# Patient Record
Sex: Female | Born: 1990 | Race: Black or African American | Hispanic: No | Marital: Single | Smoking: Current every day smoker
Health system: Southern US, Community
[De-identification: ages and names within clinical notes are randomized; demographics above are authoritative.]

## PROBLEM LIST (undated history)

## (undated) ENCOUNTER — Inpatient Hospital Stay (HOSPITAL_COMMUNITY): Payer: Self-pay

## (undated) DIAGNOSIS — A599 Trichomoniasis, unspecified: Secondary | ICD-10-CM

## (undated) DIAGNOSIS — Z789 Other specified health status: Secondary | ICD-10-CM

## (undated) DIAGNOSIS — D649 Anemia, unspecified: Secondary | ICD-10-CM

## (undated) HISTORY — PX: INDUCED ABORTION: SHX677

## (undated) HISTORY — PX: NO PAST SURGERIES: SHX2092

---
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
## 2010-03-22 NOTE — L&D Delivery Note (Signed)
Delivery Note At 1:04 AM a viable female was delivered via Vaginal, Spontaneous Delivery (Presentation: Left Occiput Anterior).  APGAR: 9/9 , ; weight to be done.   Placenta status: Intact, Spontaneous.  Cord: 3 vessels with the following complications: None.  Cord pH: not done  Anesthesia: None  Episiotomy: None Lacerations: None Suture Repair: n/a Est. Blood Loss (mL): 250  Mom to postpartum.  Baby to nursery-stable.  Mother is planning to put baby up for adoption.  Tanashia Ciesla H 03/18/2011, 1:19 AM

## 2010-05-10 ENCOUNTER — Emergency Department (HOSPITAL_COMMUNITY)
Admission: EM | Admit: 2010-05-10 | Discharge: 2010-05-10 | Disposition: A | Discharge status: Home / Self Care | Payer: Self-pay | Attending: Emergency Medicine | Admitting: Emergency Medicine

## 2010-05-10 DIAGNOSIS — A499 Bacterial infection, unspecified: Secondary | ICD-10-CM | POA: Insufficient documentation

## 2010-05-10 DIAGNOSIS — N949 Unspecified condition associated with female genital organs and menstrual cycle: Secondary | ICD-10-CM | POA: Insufficient documentation

## 2010-05-10 DIAGNOSIS — Z202 Contact with and (suspected) exposure to infections with a predominantly sexual mode of transmission: Secondary | ICD-10-CM | POA: Insufficient documentation

## 2010-05-10 DIAGNOSIS — R109 Unspecified abdominal pain: Secondary | ICD-10-CM | POA: Insufficient documentation

## 2010-05-10 DIAGNOSIS — N760 Acute vaginitis: Secondary | ICD-10-CM | POA: Insufficient documentation

## 2010-05-10 DIAGNOSIS — B9689 Other specified bacterial agents as the cause of diseases classified elsewhere: Secondary | ICD-10-CM | POA: Insufficient documentation

## 2010-05-10 LAB — BASIC METABOLIC PANEL
BUN: 6 mg/dL (ref 6–23)
Calcium: 9.6 mg/dL (ref 8.4–10.5)
Creatinine, Ser: 0.7 mg/dL (ref 0.4–1.2)
GFR calc non Af Amer: >60 mL/min (ref >60)
Glucose, Bld: 97 mg/dL (ref 70–99)
Sodium: 137 mEq/L (ref 135–145)

## 2010-05-10 LAB — URINALYSIS, ROUTINE W REFLEX MICROSCOPIC
Nitrite: NEGATIVE
Specific Gravity, Urine: 1.021 (ref 1.005–1.030)
Urobilinogen, UA: 1 mg/dL (ref 0.0–1.0)

## 2010-05-10 LAB — DIFFERENTIAL
Basophils Absolute: 0 10*3/uL (ref 0.0–0.1)
Eosinophils Relative: 0 % (ref 0–5)
Lymphocytes Relative: 34 % (ref 12–46)
Neutro Abs: 4.7 10*3/uL (ref 1.7–7.7)

## 2010-05-10 LAB — CBC
HCT: 34.2 % — ABNORMAL LOW (ref 36.0–46.0)
RDW: 15.5 % (ref 11.5–15.5)
WBC: 8.4 10*3/uL (ref 4.0–10.5)

## 2010-05-10 LAB — POCT PREGNANCY, URINE

## 2010-05-10 LAB — URINE MICROSCOPIC-ADD ON

## 2010-05-12 LAB — GC/CHLAMYDIA PROBE AMP, GENITAL

## 2010-06-06 LAB — CBC
Hemoglobin: 11.8 g/dL — ABNORMAL LOW (ref 12.0–15.0)
Platelets: 258 10*3/uL (ref 150–400)
RBC: 4.08 MIL/uL (ref 3.87–5.11)
WBC: 7.7 10*3/uL (ref 4.0–10.5)

## 2010-06-06 LAB — GC/CHLAMYDIA PROBE AMP, GENITAL

## 2010-06-06 LAB — WET PREP, GENITAL
Trich, Wet Prep: NONE SEEN
Yeast Wet Prep HPF POC: NONE SEEN

## 2010-06-06 LAB — POCT PREGNANCY, URINE

## 2010-06-06 LAB — HCG, SERUM, QUALITATIVE: Preg, Serum: NEGATIVE

## 2010-06-07 LAB — URINE MICROSCOPIC-ADD ON

## 2010-06-07 LAB — DIFFERENTIAL
Eosinophils Absolute: 0 10*3/uL (ref 0.0–0.7)
Eosinophils Relative: 1 % (ref 0–5)
Lymphocytes Relative: 37 % (ref 12–46)
Lymphs Abs: 2.3 10*3/uL (ref 0.7–4.0)
Monocytes Absolute: 0.5 10*3/uL (ref 0.1–1.0)
Monocytes Relative: 9 % (ref 3–12)

## 2010-06-07 LAB — WET PREP, GENITAL
Clue Cells Wet Prep HPF POC: NONE SEEN
WBC, Wet Prep HPF POC: NONE SEEN
Yeast Wet Prep HPF POC: NONE SEEN

## 2010-06-07 LAB — URINALYSIS, ROUTINE W REFLEX MICROSCOPIC
Glucose, UA: NEGATIVE mg/dL
Ketones, ur: NEGATIVE mg/dL
Protein, ur: NEGATIVE mg/dL
pH: 6 (ref 5.0–8.0)

## 2010-06-07 LAB — COMPREHENSIVE METABOLIC PANEL
ALT: 9 U/L (ref 0–35)
AST: 14 U/L (ref 0–37)
Albumin: 3.7 g/dL (ref 3.5–5.2)
Alkaline Phosphatase: 48 U/L (ref 39–117)
Calcium: 9.5 mg/dL (ref 8.4–10.5)
Glucose, Bld: 92 mg/dL (ref 70–99)
Potassium: 3.7 mEq/L (ref 3.5–5.1)
Sodium: 141 mEq/L (ref 135–145)
Total Protein: 6.8 g/dL (ref 6.0–8.3)

## 2010-06-07 LAB — POCT PREGNANCY, URINE

## 2010-06-07 LAB — GC/CHLAMYDIA PROBE AMP, GENITAL

## 2010-06-07 LAB — CBC
Hemoglobin: 12.3 g/dL (ref 12.0–15.0)
MCH: 28.5 pg (ref 26.0–34.0)
MCHC: 33.2 g/dL (ref 30.0–36.0)
Platelets: 273 10*3/uL (ref 150–400)
RDW: 14.8 % (ref 11.5–15.5)

## 2010-06-07 LAB — URINE CULTURE

## 2010-06-07 LAB — LIPASE, BLOOD: Lipase: 20 U/L (ref 11–59)

## 2010-07-20 ENCOUNTER — Emergency Department (HOSPITAL_COMMUNITY)
Admission: EM | Admit: 2010-07-20 | Discharge: 2010-07-20 | Disposition: A | Discharge status: Home / Self Care | Payer: Self-pay | Attending: Emergency Medicine | Admitting: Emergency Medicine

## 2010-07-20 DIAGNOSIS — R6883 Chills (without fever): Secondary | ICD-10-CM | POA: Insufficient documentation

## 2010-07-20 DIAGNOSIS — R197 Diarrhea, unspecified: Secondary | ICD-10-CM | POA: Insufficient documentation

## 2010-07-20 DIAGNOSIS — O9989 Other specified diseases and conditions complicating pregnancy, childbirth and the puerperium: Secondary | ICD-10-CM | POA: Insufficient documentation

## 2010-07-20 DIAGNOSIS — R112 Nausea with vomiting, unspecified: Secondary | ICD-10-CM | POA: Insufficient documentation

## 2010-07-20 LAB — URINALYSIS, ROUTINE W REFLEX MICROSCOPIC
Bilirubin Urine: NEGATIVE
Glucose, UA: NEGATIVE mg/dL
Hgb urine dipstick: NEGATIVE
Ketones, ur: >80 mg/dL — AB
Nitrite: NEGATIVE
Protein, ur: NEGATIVE mg/dL
Specific Gravity, Urine: 1.024 (ref 1.005–1.030)
Urobilinogen, UA: 1 mg/dL (ref 0.0–1.0)
pH: 7 (ref 5.0–8.0)

## 2010-07-20 LAB — URINE MICROSCOPIC-ADD ON

## 2010-07-20 LAB — POCT I-STAT, CHEM 8
BUN: <3 mg/dL — ABNORMAL LOW (ref 6–23)
Calcium, Ion: 1.18 mmol/L (ref 1.12–1.32)
Chloride: 101 mEq/L (ref 96–112)
Creatinine, Ser: 0.7 mg/dL (ref 0.4–1.2)
Glucose, Bld: 83 mg/dL (ref 70–99)
HCT: 37 % (ref 36.0–46.0)
Hemoglobin: 12.6 g/dL (ref 12.0–15.0)
Potassium: 3.6 meq/L (ref 3.5–5.1)
Sodium: 138 meq/L (ref 135–145)
TCO2: 24 mmol/L (ref 0–100)

## 2010-07-20 LAB — PREGNANCY, URINE: Preg Test, Ur: POSITIVE

## 2010-07-22 LAB — URINE CULTURE
Colony Count: 15000
Culture  Setup Time: 201205010855

## 2010-12-17 LAB — CBC
HCT: 37.8
Hemoglobin: 12.5
Platelets: 254
RDW: 14.9
WBC: 9.1

## 2011-01-12 ENCOUNTER — Encounter (HOSPITAL_COMMUNITY): Payer: Self-pay

## 2011-01-12 ENCOUNTER — Inpatient Hospital Stay (HOSPITAL_COMMUNITY)
Admission: AD | Admit: 2011-01-12 | Discharge: 2011-01-12 | Disposition: A | Discharge status: Home / Self Care | Payer: Medicaid Other | Source: Ambulatory Visit | Attending: Obstetrics & Gynecology | Admitting: Obstetrics & Gynecology

## 2011-01-12 ENCOUNTER — Inpatient Hospital Stay (HOSPITAL_COMMUNITY): Payer: Medicaid Other

## 2011-01-12 DIAGNOSIS — O99019 Anemia complicating pregnancy, unspecified trimester: Secondary | ICD-10-CM

## 2011-01-12 DIAGNOSIS — A5901 Trichomonal vulvovaginitis: Secondary | ICD-10-CM | POA: Insufficient documentation

## 2011-01-12 DIAGNOSIS — D649 Anemia, unspecified: Secondary | ICD-10-CM | POA: Diagnosis present

## 2011-01-12 DIAGNOSIS — J069 Acute upper respiratory infection, unspecified: Secondary | ICD-10-CM | POA: Insufficient documentation

## 2011-01-12 DIAGNOSIS — O99891 Other specified diseases and conditions complicating pregnancy: Secondary | ICD-10-CM | POA: Insufficient documentation

## 2011-01-12 DIAGNOSIS — D508 Other iron deficiency anemias: Secondary | ICD-10-CM

## 2011-01-12 DIAGNOSIS — O0930 Supervision of pregnancy with insufficient antenatal care, unspecified trimester: Secondary | ICD-10-CM

## 2011-01-12 DIAGNOSIS — O98819 Other maternal infectious and parasitic diseases complicating pregnancy, unspecified trimester: Secondary | ICD-10-CM | POA: Insufficient documentation

## 2011-01-12 DIAGNOSIS — Z8619 Personal history of other infectious and parasitic diseases: Secondary | ICD-10-CM

## 2011-01-12 HISTORY — DX: Other specified health status: Z78.9

## 2011-01-12 LAB — RAPID URINE DRUG SCREEN, HOSP PERFORMED
Barbiturates: NOT DETECTED
Cocaine: NOT DETECTED
Opiates: NOT DETECTED

## 2011-01-12 LAB — URINALYSIS, ROUTINE W REFLEX MICROSCOPIC
Hgb urine dipstick: NEGATIVE
Protein, ur: NEGATIVE mg/dL
Specific Gravity, Urine: 1.02 (ref 1.005–1.030)
Urobilinogen, UA: 2 mg/dL — ABNORMAL HIGH (ref 0.0–1.0)

## 2011-01-12 LAB — ABO/RH: ABO/RH(D): O POS

## 2011-01-12 LAB — WET PREP, GENITAL: Clue Cells Wet Prep HPF POC: NONE SEEN

## 2011-01-12 LAB — CBC
HCT: 25.1 % — ABNORMAL LOW (ref 36.0–46.0)
Hemoglobin: 7.9 g/dL — ABNORMAL LOW (ref 12.0–15.0)
MCH: 24.5 pg — ABNORMAL LOW (ref 26.0–34.0)
MCHC: 31.5 g/dL (ref 30.0–36.0)
MCV: 77.7 fL — ABNORMAL LOW (ref 78.0–100.0)
RBC: 3.23 MIL/uL — ABNORMAL LOW (ref 3.87–5.11)

## 2011-01-12 LAB — DIFFERENTIAL
Basophils Relative: 0 % (ref 0–1)
Eosinophils Absolute: 0 10*3/uL (ref 0.0–0.7)
Eosinophils Relative: 0 % (ref 0–5)
Lymphs Abs: 0.9 10*3/uL (ref 0.7–4.0)
Monocytes Absolute: 0.7 10*3/uL (ref 0.1–1.0)
Monocytes Relative: 10 % (ref 3–12)

## 2011-01-12 LAB — URINE MICROSCOPIC-ADD ON

## 2011-01-12 MED ORDER — FERROUS SULFATE 325 (65 FE) MG PO TABS: 325.0000 mg | ORAL_TABLET | Freq: Three times a day (TID) | ORAL | Status: DC

## 2011-01-12 MED ORDER — METRONIDAZOLE 500 MG PO TABS
2000.0000 mg | ORAL_TABLET | Freq: Once | ORAL | Status: AC
  Administered 2011-01-12: 2000 mg via ORAL
  Filled 2011-01-12: qty 4

## 2011-01-12 MED ORDER — PRENATAL RX 60-1 MG PO TABS: 1.0000 | ORAL_TABLET | Freq: Every day | ORAL | Status: DC

## 2011-01-12 NOTE — Progress Notes (Signed)
34WKS G2P1 NO PNC C/O PRODUCTIVE COUGH. NO ORDERS AT THIS TIME. WILL CONTINUE TO MONITOR.

## 2011-01-12 NOTE — ED Provider Notes (Signed)
History   pt is a 20 y/o G2P1001 who presents with c/o cold and cough, lightheadedness and white discharge PV  Chief Complaint  Patient presents with  . URI   URI  Associated symptoms include congestion and coughing. Pertinent negatives include no chest pain, diarrhea, dysuria, ear pain, headaches, rash, sore throat or wheezing.  : Pt reports of nasal congestion, cough with expectoration, running nose and watery eyes since the last 2 days. She report its getting worse, not tried anything, nothing makes it better and she reports exposure to her 64 y/o baby who has cold and cough symptoms over the past week. No h/o sore throat, headaches or difficulty swallowing. Pt also report of feeling lightheaded and colored spots when she gets up from a sitting or lying position since the start of her pregnancy that has got worse with time. She states that she feels dehydrated a lot and drinking water makes her feel better. She states to be really tired all the time. No h/o loss of consciousness, ringing in the ears, diplopia or weakness in her limbs. She has had a white discharge on and off since the start of her pregnancy which is thick and non-odorous. No h/o itching, burning during urination. She has h/o Chlamydia infection in 04/2010 for which she and her partner were treated. She is presently sexually active with another partner and does not use any barrier method of protection. She has not had any prenatal care and was not seen for any health concern after her present pregnancy diagnosis. She doesn't recall her LMP accurately and states that baby moves well. She has had no other health concerns during this pregnancy.  She states she wants to give the baby up for adoption.  OB History    Grav Para Term Preterm Abortions TAB SAB Ect Mult Living   2 1 1       1     1st pregnancy resulted in NSVD of a 8 lb 1 oz girl at term and had no complications. Baby is doing well presently. She had similar complaints of  lightheadedness and colored spots during that pregnancy and was told that it was due to dehydration.   Past Medical History  Diagnosis Date  . No pertinent past medical history     Past Surgical History  Procedure Date  . No past surgeries     No family history on file.  History  Substance Use Topics  . Smoking status: Current Everyday Smoker -- 0.5 packs/day  . Smokeless tobacco: Not on file  . Alcohol Use: No  Drug use: patient states that "she is around people smoking weed".  Allergies: No Known Allergies  No prescriptions prior to admission    Review of Systems  Constitutional: Positive for malaise/fatigue. Negative for fever, chills, weight loss and diaphoresis.  HENT: Positive for congestion. Negative for hearing loss, ear pain, nosebleeds, sore throat, tinnitus and ear discharge.   Eyes: Negative for blurred vision, double vision, pain, discharge and redness.  Respiratory: Positive for cough and sputum production. Negative for hemoptysis, wheezing and stridor.   Cardiovascular: Negative for chest pain, palpitations, claudication and leg swelling.  Gastrointestinal: Negative for heartburn, diarrhea, constipation, blood in stool and melena.  Genitourinary: Negative for dysuria, urgency and frequency.  Skin: Negative for itching and rash.  Neurological: Positive for dizziness. Negative for tremors, focal weakness, seizures, loss of consciousness and headaches.  Endo/Heme/Allergies: Negative for polydipsia. Does not bruise/bleed easily.   Physical Exam   Blood pressure  101/66, pulse 117, temperature 98.5 F (36.9 C), temperature source Oral, resp. rate 16, height 5' 2.25" (1.581 m), weight 61.78 kg (136 lb 3.2 oz), last menstrual period 05/17/2010, SpO2 98.00%. - LMP unsure  Physical Exam  Vitals reviewed. Constitutional: She appears well-developed. She appears distressed.       Mild distress  HENT:  Head: Normocephalic and atraumatic.  Right Ear: External ear  normal. No drainage. No decreased hearing is noted.  Left Ear: External ear normal. No drainage. No decreased hearing is noted.  Nose: Mucosal edema and rhinorrhea present. No nose lacerations or sinus tenderness. No epistaxis. Right sinus exhibits no maxillary sinus tenderness and no frontal sinus tenderness. Left sinus exhibits no maxillary sinus tenderness and no frontal sinus tenderness.  Mouth/Throat: No oral lesions. Normal dentition. No uvula swelling. No oropharyngeal exudate, posterior oropharyngeal edema or tonsillar abscesses.  Eyes: Conjunctivae are normal. Right eye exhibits no discharge. Left eye exhibits no discharge. No scleral icterus.       Pallor +  Cardiovascular: Normal rate, regular rhythm, normal heart sounds and intact distal pulses.   Respiratory: Effort normal and breath sounds normal. No respiratory distress. She has no wheezes. She has no rales. She exhibits no tenderness.  GI: Soft. Bowel sounds are normal.  Genitourinary: There is no rash or tenderness on the right labia. There is no rash or tenderness on the left labia. No erythema or bleeding around the vagina. No foreign body around the vagina. No signs of injury around the vagina. Vaginal discharge found.       Fundal height: 33 cm  Lymphadenopathy:    She has no cervical adenopathy.  Skin: Skin is dry. No cyanosis. No pallor.       Nails: pallor +    MAU Course  Procedures ANC panel Pelvic and Cervical examination, Wet Prep CBC, HB Electrophoresis Urine Drug Screen Assessment and Plan  1. US Abdomen and Pelvis: showed estimated gestation age 29 weeks 1 day with no suspected disproportion in growth. Placenta attached to anterior wall 2. Lightheadedness: likely due to anemia RBC 3.23, Hb 7.9, Hct 25.1. Refer to clinic for possible follow up for her pregnancy with results of Hb Electrophoresis. Iron supplements prescribed   3. Cold and Cough: Viral URI, Mucin ex during day time and cough drops during the  night to help her sleep. Warm drinks and plenty of fluids to keep her hydrated 3. Discharge PV: Likely Vaginal candidiasis. Possible GC/ Chlamydia -  Work up ordered 4. Urine drug screen - to check for possible illicit drug use during pregnancy     Chetan Kapat 01/12/2011, 5:45 PM   Additional Dx: Trichomonas-Tx Flagyl 2 gm PO now. [facility]    Plan: 1. Will schedule appt at [facility] in 2 weeks 2. FeSO4 325 PO TID and increase iron rich-foods 3. PLT precautions  Dorathy Kinsman 01/12/2011 10:23 PM

## 2011-01-12 NOTE — ED Provider Notes (Signed)
Attestation of Attending Supervision of Advanced Practitioner: Evaluation and management procedures were performed by the PA/NP/CNM/OB Fellow under my supervision/collaboration. Chart reviewed and agree with management and plan.  Jaynie Collins A M.D. 01/12/2011 11:50 PM

## 2011-01-12 NOTE — Progress Notes (Signed)
Pt c/o productive cough with thin clear mucous since yesterday. No fever. Abdominal pain when she coughs. Taking nothing for cough or pain. Pt has had no PNC.

## 2011-01-12 NOTE — Progress Notes (Signed)
Pt returned from u/s via wc, PA student at bedside to collect H&P.

## 2011-01-12 NOTE — Progress Notes (Signed)
Patient states she has had no prenatal care and is advanced pregnant. Has had a cold for the past couple of days. Has upper abdominal pain with coughing. No bleeding or leaking and reports good fetal movement.

## 2011-01-13 LAB — GC/CHLAMYDIA PROBE AMP, GENITAL: Chlamydia, DNA Probe: NEGATIVE

## 2011-01-14 ENCOUNTER — Encounter (HOSPITAL_COMMUNITY): Payer: Self-pay | Admitting: *Deleted

## 2011-01-14 ENCOUNTER — Inpatient Hospital Stay (HOSPITAL_COMMUNITY)
Admission: AD | Admit: 2011-01-14 | Discharge: 2011-01-14 | Disposition: A | Discharge status: Home / Self Care | Payer: Medicaid Other | Source: Ambulatory Visit | Attending: Obstetrics and Gynecology | Admitting: Obstetrics and Gynecology

## 2011-01-14 DIAGNOSIS — J069 Acute upper respiratory infection, unspecified: Secondary | ICD-10-CM | POA: Insufficient documentation

## 2011-01-14 DIAGNOSIS — B9789 Other viral agents as the cause of diseases classified elsewhere: Secondary | ICD-10-CM

## 2011-01-14 DIAGNOSIS — O99891 Other specified diseases and conditions complicating pregnancy: Secondary | ICD-10-CM | POA: Insufficient documentation

## 2011-01-14 HISTORY — DX: Anemia, unspecified: D64.9

## 2011-01-14 LAB — HEMOGLOBINOPATHY EVALUATION
Hemoglobin Other: 0 % (ref 0.0–0.0)
Hgb A: 97.2 % (ref 96.8–97.8)
Hgb S Quant: 0 % (ref 0.0–0.0)

## 2011-01-14 MED ORDER — GUAIFENESIN 100 MG/5ML PO SOLN
5.0000 mL | Freq: Once | ORAL | Status: AC
  Administered 2011-01-14: 100 mg via ORAL
  Filled 2011-01-14: qty 15

## 2011-01-14 MED ORDER — PROMETHAZINE HCL 25 MG PO TABS
12.5000 mg | ORAL_TABLET | Freq: Once | ORAL | Status: AC
  Administered 2011-01-14: 12.5 mg via ORAL
  Filled 2011-01-14: qty 1

## 2011-01-14 NOTE — Progress Notes (Signed)
G2P1 at 31.2wks. Awoke at 2345 and was having trouble breathing. Has had cold for 3 days. Denies fever. Has h/a and productive cough-yellow sputum.

## 2011-01-14 NOTE — ED Provider Notes (Signed)
History     Chief Complaint  Patient presents with  . URI   HPI Patient presents to MAU via EMS for URI. She says she awoke from sleep with shortness of breath, so she called an ambulance. She currently has a cough, back pain, headache, nausea and vomiting. This has been presents for three to four days and has not changed since her visit to the MAU on 10/23. At this time she was prescribed Mucinex. However, she has not purchased the medication, because she does the money. The patient denies contractions, leakage of fluid, vaginal bleeding, and notes fetal movement.    OB History    Grav Para Term Preterm Abortions TAB SAB Ect Mult Living   2 1 1       1       Past Medical History  Diagnosis Date  . No pertinent past medical history   . Anemia     Past Surgical History  Procedure Date  . No past surgeries     No family history on file.  History  Substance Use Topics  . Smoking status: Current Everyday Smoker -- 0.5 packs/day  . Smokeless tobacco: Not on file  . Alcohol Use: No    Allergies: No Known Allergies  Prescriptions prior to admission  Medication Sig Dispense Refill  . ferrous sulfate 325 (65 FE) MG tablet Take 1 tablet (325 mg total) by mouth 3 (three) times daily with meals.  30 tablet  11  . Prenatal Vit-Fe Fumarate-FA (PRENATAL MULTIVITAMIN) 60-1 MG tablet Take 1 tablet by mouth daily.  30 tablet  6    ROS See HPI Physical Exam   Blood pressure 107/61, pulse 73, temperature 97.1 F (36.2 C), resp. rate 20, height 5\' 2"  (1.575 m), weight 61.689 kg (136 lb), last menstrual period 05/17/2010, SpO2 99.00%.  Physical Exam  Constitutional: She appears well-developed. She appears distressed.  HENT:  Mouth/Throat: Oropharynx is clear and moist. No oropharyngeal exudate.  Eyes: Conjunctivae are normal. Pupils are equal, round, and reactive to light.  Respiratory: Effort normal and breath sounds normal.       Persistent Cough   GI:       Gravid, non-tender    Lymphadenopathy:    She has no cervical adenopathy.    MAU Course  Procedures None MDM Fetoscope - 140s, moderate variability, no accels, no decels;  Tocometer - no uterine contractions   Assessment and Plan  Patient with viral URI, Given patient anti-tussive and promethazine for nausea.  Encouraged to follow-up for [facility] on 11/13 at [facility].   Clinton Sawyer, Kyandra Mcclaine 01/14/2011, 2:54 AM

## 2011-01-14 NOTE — Progress Notes (Signed)
Pt reports she awakened at 2130 and was having trouble breathing. Has had cold symptoms x 3 days, cough,congestion. No fever. Was seen yesterday and told to take Mucinex but has not taken any medicines today.complains of headache, pain in upper back.

## 2011-01-15 ENCOUNTER — Inpatient Hospital Stay (HOSPITAL_COMMUNITY)
Admission: AD | Admit: 2011-01-15 | Discharge: 2011-01-15 | Disposition: A | Discharge status: Home / Self Care | Payer: Medicaid Other | Source: Ambulatory Visit | Attending: Obstetrics & Gynecology | Admitting: Obstetrics & Gynecology

## 2011-01-15 ENCOUNTER — Encounter (HOSPITAL_COMMUNITY): Payer: Self-pay | Admitting: *Deleted

## 2011-01-15 DIAGNOSIS — O2390 Unspecified genitourinary tract infection in pregnancy, unspecified trimester: Secondary | ICD-10-CM | POA: Insufficient documentation

## 2011-01-15 DIAGNOSIS — N760 Acute vaginitis: Secondary | ICD-10-CM | POA: Insufficient documentation

## 2011-01-15 DIAGNOSIS — Z202 Contact with and (suspected) exposure to infections with a predominantly sexual mode of transmission: Secondary | ICD-10-CM | POA: Insufficient documentation

## 2011-01-15 MED ORDER — CEFTRIAXONE SODIUM 250 MG IJ SOLR
250.0000 mg | Freq: Once | INTRAMUSCULAR | Status: AC
  Administered 2011-01-15: 250 mg via INTRAMUSCULAR
  Filled 2011-01-15: qty 250

## 2011-01-15 MED ORDER — FLUCONAZOLE 200 MG PO TABS
200.0000 mg | ORAL_TABLET | Freq: Once | ORAL | Status: AC
  Administered 2011-01-15: 200 mg via ORAL
  Filled 2011-01-15: qty 1

## 2011-01-15 MED ORDER — AZITHROMYCIN 250 MG PO TABS
1000.0000 mg | ORAL_TABLET | Freq: Once | ORAL | Status: AC
  Administered 2011-01-15: 1000 mg via ORAL
  Filled 2011-01-15: qty 4

## 2011-01-15 MED ORDER — FLUCONAZOLE 200 MG PO TABS: 200.0000 mg | ORAL_TABLET | Freq: Once | ORAL | Status: AC

## 2011-01-15 NOTE — Progress Notes (Signed)
Pt was told she needed to be seen due to partner being diagnosed with non gonococcal urethritis.  Denies any abdominal pain, bleeding, or abnormal discharge.  + FM.

## 2011-01-15 NOTE — ED Provider Notes (Signed)
Chief Complaint:  Vaginal Itching   Tina Knox is  20 y.o. G2P1001.  Patient's last menstrual period was 05/17/2010.. [redacted]w[redacted]d   She presents complaining of Vaginal Itching Reports boyfriend was seen at [facility] yesterday and dx with NGU. Pt was dx with Trich in MAU earlier this week and tx'd with flagyl. Presents today with report of clitoral swelling, vaginal itching, and concern given boyfriend's recent dx.   Obstetrical/Gynecological History: OB History    Grav Para Term Preterm Abortions TAB SAB Ect Mult Living   2 1 1       1       Past Medical History: Past Medical History  Diagnosis Date  . No pertinent past medical history   . Anemia     Past Surgical History: Past Surgical History  Procedure Date  . No past surgeries     Family History: No family history on file.  Social History: History  Substance Use Topics  . Smoking status: Current Everyday Smoker -- 0.5 packs/day  . Smokeless tobacco: Not on file  . Alcohol Use: No    Allergies: No Known Allergies  Prescriptions prior to admission  Medication Sig Dispense Refill  . ferrous sulfate 325 (65 FE) MG tablet Take 1 tablet (325 mg total) by mouth 3 (three) times daily with meals.  30 tablet  11  . Prenatal Vit-Fe Fumarate-FA (PRENATAL MULTIVITAMIN) 60-1 MG tablet Take 1 tablet by mouth daily.  30 tablet  6    Review of Systems - Negative except what has been reviewed in HPI  Physical Exam   Blood pressure 99/61, pulse 92, temperature 98.1 F (36.7 C), resp. rate 18, height 5\' 2"  (1.575 m), weight 61.689 kg (136 lb), last menstrual period 05/17/2010.  General: General appearance - alert, well appearing, and in no distress and oriented to person, place, and time Mental status - alert, oriented to person, place, and time, normal mood, behavior, speech, dress, motor activity, and thought processes, affect appropriate to mood Abdomen - gravid, nontender FHR: 130, mod variabilty, +accels, no decels Toco: 2 UC in  2 hours Focused Gynecological Exam: VULVA: vulvar edema , vulvar erythema, clitoral irritation noted. While clumpy discharge on perineum  MDM: Exposure to STI   Assessment: Vaginitis Exposure to STI  Plan: Prophylaxis with Zithromax and Rocephin Dilfucan 200mg  in MAU Discharge home. FU as scheduled for prenatal visit.  Sherran Margolis E. 01/15/2011,4:30 PM

## 2011-01-15 NOTE — Progress Notes (Signed)
Treated in MAU on Tuesday for Trich, boyfriend has since been Dx with non-gonococcal urethritis and she has vaginal soreness and redness

## 2011-01-26 ENCOUNTER — Encounter: Payer: Self-pay | Admitting: Advanced Practice Midwife

## 2011-01-26 DIAGNOSIS — F1290 Cannabis use, unspecified, uncomplicated: Secondary | ICD-10-CM | POA: Insufficient documentation

## 2011-02-02 ENCOUNTER — Ambulatory Visit (INDEPENDENT_AMBULATORY_CARE_PROVIDER_SITE_OTHER): Payer: Medicaid Other

## 2011-02-02 DIAGNOSIS — O99320 Drug use complicating pregnancy, unspecified trimester: Secondary | ICD-10-CM

## 2011-02-02 DIAGNOSIS — O0930 Supervision of pregnancy with insufficient antenatal care, unspecified trimester: Secondary | ICD-10-CM

## 2011-02-02 DIAGNOSIS — O99019 Anemia complicating pregnancy, unspecified trimester: Secondary | ICD-10-CM

## 2011-02-02 DIAGNOSIS — Z3480 Encounter for supervision of other normal pregnancy, unspecified trimester: Secondary | ICD-10-CM

## 2011-02-02 DIAGNOSIS — Z23 Encounter for immunization: Secondary | ICD-10-CM

## 2011-02-02 MED ORDER — INFLUENZA VIRUS VACC SPLIT PF IM SUSP: 0.5000 mL | INTRAMUSCULAR | Status: AC

## 2011-02-02 MED ORDER — TETANUS-DIPHTH-ACELL PERTUSSIS 5-2.5-18.5 LF-MCG/0.5 IM SUSP: 0.5000 mL | Freq: Once | INTRAMUSCULAR | Status: DC

## 2011-02-02 NOTE — Progress Notes (Signed)
Pt received education booklet and smoking cessation pamphlet as well.  Pt received flu vaccine and also Tdap vaccine.

## 2011-02-08 ENCOUNTER — Ambulatory Visit (INDEPENDENT_AMBULATORY_CARE_PROVIDER_SITE_OTHER): Payer: Medicaid Other | Admitting: Obstetrics & Gynecology

## 2011-02-08 DIAGNOSIS — F1290 Cannabis use, unspecified, uncomplicated: Secondary | ICD-10-CM

## 2011-02-08 DIAGNOSIS — F1210 Cannabis abuse, uncomplicated: Secondary | ICD-10-CM

## 2011-02-08 DIAGNOSIS — Z0282 Encounter for adoption services: Secondary | ICD-10-CM

## 2011-02-08 DIAGNOSIS — F1920 Other psychoactive substance dependence, uncomplicated: Secondary | ICD-10-CM

## 2011-02-08 DIAGNOSIS — O0930 Supervision of pregnancy with insufficient antenatal care, unspecified trimester: Secondary | ICD-10-CM

## 2011-02-08 LAB — POCT URINALYSIS DIP (DEVICE)
Bilirubin Urine: NEGATIVE
Hgb urine dipstick: NEGATIVE
Ketones, ur: NEGATIVE mg/dL
Nitrite: NEGATIVE
pH: 7.5 (ref 5.0–8.0)

## 2011-02-08 NOTE — Progress Notes (Signed)
Patient considering putting baby up for adoption, she will meet with SW today.  No other complaints.  Fetal movement and preterm labor precautions reviewed. Return to clinic in 1 week.

## 2011-02-08 NOTE — Patient Instructions (Addendum)
Adoption  Adoption is a legal and permanent agreement that is coordinated by adoption agencies and lawyers. There are many resources available to help you place your child up for adoption or understand how to adopt a child. Adoption can take months to years and patience is very helpful during this exciting and stressful time.  TYPES OF ADOPTION  Domestic - Children are adopted within their country of birth. Adoption processes, waiting periods, and other legal rules can vary from state to state.   Open/Semi-open- Communication between the birth parent(s) and the adoptive parent(s) occurs.   Closed - The adoption is anonymous. There is no communication between the birth parent(s) and the adoptive parent(s) and only general and private information may be exchanged.   International Engineer, building services) - Children from one country are adopted by families from a different country. International adoption processes, waiting periods, and other legal rules vary from country to country.   The [employer] is an agreement between participating countries that adds a level of process and standards to foster a healthy and safe international adoption.  HOW DOES ADOPTION WORK?  Most adoptions are handled by public or private adoption agencies. Agency staff screen, interview, and assist with the matching of birth parents and families wishing to adopt. They provide counseling and support. All public and many private agencies are licensed and regulated by the government.  In independent adoptions, the biological parents and families wishing to adopt have chosen to privately coordinate an adoption. Independent adoptions are handled by a Clinical research associate, rather than an agency, and are only legal in certain states where they are regulated by strict laws. Malen Gauze parents sometimes have the option of adopting their foster children. Foster care refers to temporarily parenting a child who has been separated from his or her  parents. Foster care is a cooperative effort between foster parents and a Chief of Staff, while adoption is full legal responsibility. Some steps that occur during an adoption include:  Contacting a licensed adoption agency of your choice.   Beginning the application and screening process.   Participating in a home study interview and assessment.   Taking any necessary classes or programs about adoption and parenting an adopted child.   Waiting for placement information (adoptive parents) or review placement information (biological parents).   Meeting your child.   Legalizing the adoption by filing specific legal documents, or participating in court proceedings.  FOR MORE INFORMATION  There are many resources and programs available to help you navigate the entire adoption process. Contact your state's health and human services agency. Also, several online resources exist, such as: Infant Adoption Awareness: www.infantadopt.Kelly Services for Adoption: www.adoptioncouncil.org [place] Department of State ([employer]) http://adoption.MakeupDiscounts.com.br.php Document Released: 08/26/2009 Document Revised: 11/18/2010 Document Reviewed: 08/26/2009 [facility] Patient Information 2012 [place], [place].

## 2011-03-17 ENCOUNTER — Encounter (HOSPITAL_COMMUNITY): Payer: Self-pay

## 2011-03-17 ENCOUNTER — Inpatient Hospital Stay (HOSPITAL_COMMUNITY)
Admission: AD | Admit: 2011-03-17 | Discharge: 2011-03-20 | DRG: 774 | Disposition: A | Discharge status: Home / Self Care | Payer: Medicaid Other | Source: Ambulatory Visit | Attending: Obstetrics & Gynecology | Admitting: Obstetrics & Gynecology

## 2011-03-17 DIAGNOSIS — O9902 Anemia complicating childbirth: Principal | ICD-10-CM | POA: Diagnosis present

## 2011-03-17 DIAGNOSIS — Z0282 Encounter for adoption services: Secondary | ICD-10-CM

## 2011-03-17 DIAGNOSIS — D649 Anemia, unspecified: Secondary | ICD-10-CM

## 2011-03-17 DIAGNOSIS — O0930 Supervision of pregnancy with insufficient antenatal care, unspecified trimester: Secondary | ICD-10-CM

## 2011-03-17 DIAGNOSIS — O9882 Other maternal infectious and parasitic diseases complicating childbirth: Secondary | ICD-10-CM | POA: Diagnosis present

## 2011-03-17 DIAGNOSIS — A5901 Trichomonal vulvovaginitis: Secondary | ICD-10-CM | POA: Diagnosis present

## 2011-03-17 LAB — CBC
Hemoglobin: 11.9 g/dL — ABNORMAL LOW (ref 12.0–15.0)
MCH: 26.7 pg (ref 26.0–34.0)
MCV: 81.8 fL (ref 78.0–100.0)
RBC: 4.46 MIL/uL (ref 3.87–5.11)
WBC: 8.6 10*3/uL (ref 4.0–10.5)

## 2011-03-17 MED ORDER — ONDANSETRON HCL 4 MG/2ML IJ SOLN: 4.0000 mg | Freq: Four times a day (QID) | INTRAMUSCULAR | Status: DC | PRN

## 2011-03-17 MED ORDER — OXYTOCIN 20 UNITS IN LACTATED RINGERS INFUSION - SIMPLE: 125.0000 mL/h | Freq: Once | INTRAVENOUS | Status: DC

## 2011-03-17 MED ORDER — NALBUPHINE SYRINGE 5 MG/0.5 ML: 10.0000 mg | INJECTION | INTRAMUSCULAR | Status: DC | PRN

## 2011-03-17 MED ORDER — FLEET ENEMA 7-19 GM/118ML RE ENEM: 1.0000 | ENEMA | RECTAL | Status: DC | PRN

## 2011-03-17 MED ORDER — OXYCODONE-ACETAMINOPHEN 5-325 MG PO TABS
2.0000 | ORAL_TABLET | ORAL | Status: DC | PRN
  Administered 2011-03-18: 2 via ORAL

## 2011-03-17 MED ORDER — PRENATAL MULTIVITAMIN CH
1.0000 | ORAL_TABLET | Freq: Every day | ORAL | Status: DC
  Administered 2011-03-18: 1 via ORAL
  Filled 2011-03-17 (×2): qty 1

## 2011-03-17 MED ORDER — LACTATED RINGERS IV SOLN: 500.0000 mL | INTRAVENOUS | Status: DC | PRN

## 2011-03-17 MED ORDER — FENTANYL CITRATE 0.05 MG/ML IJ SOLN
100.0000 ug | INTRAMUSCULAR | Status: DC | PRN
  Administered 2011-03-17: 100 ug via INTRAVENOUS
  Administered 2011-03-17 (×2): 50 ug via INTRAVENOUS
  Filled 2011-03-17 (×3): qty 2

## 2011-03-17 MED ORDER — FERROUS SULFATE 325 (65 FE) MG PO TABS
325.0000 mg | ORAL_TABLET | Freq: Three times a day (TID) | ORAL | Status: DC
  Administered 2011-03-18 – 2011-03-19 (×2): 325 mg via ORAL
  Filled 2011-03-17 (×5): qty 1

## 2011-03-17 MED ORDER — LIDOCAINE HCL (PF) 1 % IJ SOLN
30.0000 mL | INTRAMUSCULAR | Status: DC | PRN
  Filled 2011-03-17: qty 30

## 2011-03-17 MED ORDER — IBUPROFEN 600 MG PO TABS
600.0000 mg | ORAL_TABLET | Freq: Four times a day (QID) | ORAL | Status: DC | PRN
  Administered 2011-03-18: 600 mg via ORAL

## 2011-03-17 MED ORDER — OXYTOCIN BOLUS FROM INFUSION
500.0000 mL | Freq: Once | INTRAVENOUS | Status: AC
  Administered 2011-03-18: 500 mL via INTRAVENOUS
  Filled 2011-03-17: qty 1000
  Filled 2011-03-17: qty 500

## 2011-03-17 MED ORDER — ACETAMINOPHEN 325 MG PO TABS: 650.0000 mg | ORAL_TABLET | ORAL | Status: DC | PRN

## 2011-03-17 MED ORDER — CITRIC ACID-SODIUM CITRATE 334-500 MG/5ML PO SOLN: 30.0000 mL | ORAL | Status: DC | PRN

## 2011-03-17 MED ORDER — LACTATED RINGERS IV SOLN
INTRAVENOUS | Status: DC
  Administered 2011-03-17: 23:00:00 via INTRAVENOUS

## 2011-03-17 NOTE — H&P (Signed)
Chief Complaint:  Labor  Tina Knox is  20 y.o. G2P1001.  Patient's last menstrual period was 05/17/2010. [redacted]w[redacted]d   Onset is described as unclear and has been present for  1 days. Denies bleeding or LOF. Reports good FM  Obstetrical/Gynecological History: OB History    Grav Para Term Preterm Abortions TAB SAB Ect Mult Living   2 1 1       1       Past Medical History: Past Medical History  Diagnosis Date  . Anemia     Past Surgical History: Past Surgical History  Procedure Date  . No past surgeries     Family History: No family history on file.  Social History: History  Substance Use Topics  . Smoking status: Current Some Day Smoker -- 0.5 packs/day    Types: Cigarettes  . Smokeless tobacco: Never Used  . Alcohol Use: No    Allergies: No Known Allergies  Prescriptions prior to admission  Medication Sig Dispense Refill  . ferrous sulfate 325 (65 FE) MG tablet Take 325 mg by mouth 3 (three) times daily with meals.        . Prenatal Vit-Fe Fumarate-FA (PRENATAL MULTIVITAMIN) 60-1 MG tablet Take 1 tablet by mouth daily.          Review of Systems - History obtained from chart review and the patient General ROS: negative Allergy and Immunology ROS: negative Hematological and Lymphatic ROS: negative Endocrine ROS: negative Breast ROS: negative Respiratory ROS: no cough, shortness of breath, or wheezing Cardiovascular ROS: no chest pain or dyspnea on exertion Gastrointestinal ROS: positive for - abdominal pain Genito-Urinary ROS: no dysuria, trouble voiding, or hematuria Neurological ROS: negative  Physical Exam   Temperature 98.3 F (36.8 C), temperature source Oral, resp. rate 20, height 5\' 2"  (1.575 m), weight 170 lb (77.111 kg), last menstrual period 05/17/2010.  General: General appearance - alert, well appearing, and in no distress, oriented to person, place, and time, normal appearing weight and uncomfortable Mental status - alert, oriented to  person, place, and time, agitated Eyes - left eye normal, right eye normal Mouth - mucous membranes moist, pharynx normal without lesions Neck - supple, no significant adenopathy Lymphatics - no palpable lymphadenopathy, no hepatosplenomegaly Chest - clear to auscultation, no wheezes, rales or rhonchi, symmetric air entry Heart - normal rate, regular rhythm, normal S1, S2, no murmurs, rubs, clicks or gallops Abdomen - soft, nontender, nondistended, no masses or organomegaly Neurological - alert, oriented, normal speech, no focal findings or movement disorder noted Musculoskeletal - no joint tenderness, deformity or swelling Extremities - peripheral pulses normal, no pedal edema, no clubbing or cyanosis Focused Gynecological Exam: 6-7/90/vtx/-2/BBOW  Labs: Recent Results (from the past 24 hour(s))  CBC   Collection Time   03/17/11  8:56 PM      Component Value Range   WBC 8.6  4.0 - 10.5 (K/uL)   RBC 4.46  3.87 - 5.11 (MIL/uL)   Hemoglobin 11.9 (*) 12.0 - 15.0 (g/dL)   HCT 16.1  09.6 - 04.5 (%)   MCV 81.8  78.0 - 100.0 (fL)   MCH 26.7  26.0 - 34.0 (pg)   MCHC 32.6  30.0 - 36.0 (g/dL)   RDW 40.9 (*) 81.1 - 15.5 (%)   Platelets 207  150 - 400 (K/uL)     Assessment: Active Labor Patient Active Problem List  Diagnoses  . Anemia  . Insufficient prenatal care  . Trichomoniasis of vagina  . Marijuana smoker  . Considering  putting baby up for adoption    Plan: Admit routine order Unknown GBS Fentanyl prn  Nansi Birmingham E. 03/17/2011,9:24 PM

## 2011-03-17 NOTE — Progress Notes (Addendum)
Patient states lost mucous plug this evening and started contracting every 2 minutes. Denies vaginal bleeding. Denies complications with this pregnancy. States baby is being adopted out. Informed. Dr. Durene Cal of SVE and requested orders: told Dr. Durene Cal that GBS states is not available. Informed Dr. Durene Cal of variable decel upon patient being put on monitor.  Orders received for patient to be admitted.

## 2011-03-18 ENCOUNTER — Encounter (HOSPITAL_COMMUNITY): Payer: Self-pay | Admitting: *Deleted

## 2011-03-18 DIAGNOSIS — A5901 Trichomonal vulvovaginitis: Secondary | ICD-10-CM

## 2011-03-18 DIAGNOSIS — D649 Anemia, unspecified: Secondary | ICD-10-CM

## 2011-03-18 DIAGNOSIS — O9902 Anemia complicating childbirth: Secondary | ICD-10-CM

## 2011-03-18 DIAGNOSIS — O9882 Other maternal infectious and parasitic diseases complicating childbirth: Secondary | ICD-10-CM

## 2011-03-18 LAB — RPR: RPR Ser Ql: NONREACTIVE

## 2011-03-18 MED ORDER — LANOLIN HYDROUS EX OINT: TOPICAL_OINTMENT | CUTANEOUS | Status: DC | PRN

## 2011-03-18 MED ORDER — OXYTOCIN 20 UNITS IN LACTATED RINGERS INFUSION - SIMPLE
125.0000 mL/h | INTRAVENOUS | Status: DC | PRN
  Filled 2011-03-18: qty 1000

## 2011-03-18 MED ORDER — ONDANSETRON HCL 4 MG/2ML IJ SOLN: 4.0000 mg | INTRAMUSCULAR | Status: DC | PRN

## 2011-03-18 MED ORDER — DIBUCAINE 1 % RE OINT: 1.0000 "application " | TOPICAL_OINTMENT | RECTAL | Status: DC | PRN

## 2011-03-18 MED ORDER — METHYLERGONOVINE MALEATE 0.2 MG/ML IJ SOLN: 0.2000 mg | INTRAMUSCULAR | Status: DC | PRN

## 2011-03-18 MED ORDER — DIPHENHYDRAMINE HCL 25 MG PO CAPS: 25.0000 mg | ORAL_CAPSULE | Freq: Four times a day (QID) | ORAL | Status: DC | PRN

## 2011-03-18 MED ORDER — BENZOCAINE-MENTHOL 20-0.5 % EX AERO
1.0000 "application " | INHALATION_SPRAY | CUTANEOUS | Status: DC | PRN
  Administered 2011-03-18: 1 via TOPICAL
  Filled 2011-03-18: qty 56

## 2011-03-18 MED ORDER — METHYLERGONOVINE MALEATE 0.2 MG PO TABS: 0.2000 mg | ORAL_TABLET | ORAL | Status: DC | PRN

## 2011-03-18 MED ORDER — WITCH HAZEL-GLYCERIN EX PADS: 1.0000 "application " | MEDICATED_PAD | CUTANEOUS | Status: DC | PRN

## 2011-03-18 MED ORDER — TETANUS-DIPHTH-ACELL PERTUSSIS 5-2.5-18.5 LF-MCG/0.5 IM SUSP
0.5000 mL | Freq: Once | INTRAMUSCULAR | Status: DC
  Filled 2011-03-18: qty 0.5

## 2011-03-18 MED ORDER — ZOLPIDEM TARTRATE 5 MG PO TABS: 5.0000 mg | ORAL_TABLET | Freq: Every evening | ORAL | Status: DC | PRN

## 2011-03-18 MED ORDER — SENNOSIDES-DOCUSATE SODIUM 8.6-50 MG PO TABS
2.0000 | ORAL_TABLET | Freq: Every day | ORAL | Status: DC
  Administered 2011-03-18 – 2011-03-19 (×2): 2 via ORAL

## 2011-03-18 MED ORDER — IBUPROFEN 600 MG PO TABS
600.0000 mg | ORAL_TABLET | Freq: Four times a day (QID) | ORAL | Status: DC
  Administered 2011-03-18 – 2011-03-20 (×8): 600 mg via ORAL
  Filled 2011-03-18 (×11): qty 1

## 2011-03-18 MED ORDER — SIMETHICONE 80 MG PO CHEW: 80.0000 mg | CHEWABLE_TABLET | ORAL | Status: DC | PRN

## 2011-03-18 MED ORDER — ONDANSETRON HCL 4 MG PO TABS: 4.0000 mg | ORAL_TABLET | ORAL | Status: DC | PRN

## 2011-03-18 MED ORDER — PRENATAL MULTIVITAMIN CH
1.0000 | ORAL_TABLET | Freq: Every day | ORAL | Status: DC
  Administered 2011-03-19 – 2011-03-20 (×2): 1 via ORAL
  Filled 2011-03-18: qty 1

## 2011-03-18 MED ORDER — OXYCODONE-ACETAMINOPHEN 5-325 MG PO TABS
1.0000 | ORAL_TABLET | ORAL | Status: DC | PRN
  Administered 2011-03-18: 1 via ORAL
  Filled 2011-03-18: qty 2
  Filled 2011-03-18: qty 1

## 2011-03-18 NOTE — Progress Notes (Signed)
UR Chart review completed.  

## 2011-03-19 MED ORDER — IBUPROFEN 600 MG PO TABS: 600.0000 mg | ORAL_TABLET | Freq: Four times a day (QID) | ORAL | Status: AC

## 2011-03-19 NOTE — Progress Notes (Signed)
Post Partum Day 2 Subjective: no complaints, up ad lib, voiding, tolerating PO and + flatus  Objective: Blood pressure 121/76, pulse 63, temperature 98.3 F (36.8 C), temperature source Oral, resp. rate 16, height 5\' 2"  (1.575 m), weight 170 lb (77.111 kg), last menstrual period 05/17/2010, SpO2 100.00%, unknown if currently breastfeeding.  Physical Exam:  General: alert and no distress Lochia: appropriate Uterine Fundus: firm DVT Evaluation: No evidence of DVT seen on physical exam. Negative Homan's sign. No cords or calf tenderness.   Basename 03/17/11 2056  HGB 11.9*  HCT 36.5    Assessment/Plan: Discharge home Social Work consult for adoption of baby Contraception Depo Provera Follow up in clinic on 04/15/10 at 9:15am   LOS: 2 days   ANYANWU,UGONNA A 03/19/2011, 9:14 AM

## 2011-03-19 NOTE — Progress Notes (Signed)
PSYCHOSOCIAL ASSESSMENT ~ MATERNAL/CHILD Name:  Tina Knox                                                                          Age: 20  Referral Date:       12 /28/12   Reason/Source: Adoption / CN  I. FAMILY/HOME ENVIRONMENT A. Child's Legal Guardian _X__Parent(s) ___Grandparent ___Foster parent ___DSS_________________ Name:  Tina Knox                                    DOB: //                     Age: 20  Address: [address]; [place], [place] [address]  Name: Tina Knox                                           DOB: //                     Age: 20  Address:  B. Other Household Members/Support Persons Name:                                         Relationship: daughter 3yr   DOB ___/___/___                   Name:                                         Relationship:                        DOB ___/___/___                   Name:                                         Relationship:                        DOB ___/___/___                   Name:                                         Relationship:                        DOB ___/___/___  C. Other Support:   II. PSYCHOSOCIAL DATA A. Information Source                                                                                             

## 2011-03-19 NOTE — Discharge Summary (Signed)
Obstetric Discharge Summary Reason for Admission: onset of labor Prenatal Procedures: NST Intrapartum Procedures: spontaneous vaginal delivery Postpartum Procedures: none Complications-Operative and Postpartum: none  Delivery Note At 1:04 AM a viable female was delivered via Vaginal, Spontaneous Delivery (Presentation: Left Occiput Anterior).  APGAR: 9, 9; weight 7 lb 15.9 oz (3625 g).   Placenta status: Intact, Spontaneous.  Cord: 3 vessels with the following complications: None.    Anesthesia: None  Episiotomy: None Lacerations: None Est. Blood Loss (mL): 250 ml  Mom to postpartum.  Baby to nursery-stable. Uncomplicated postpartum course.  Mother decided to put baby up for adoption and is being helped by Social Work.    H/H:  Lab Results  Component Value Date/Time   HGB 11.9* 03/17/2011  8:56 PM   HCT 36.5 03/17/2011  8:56 PM    Discharge Diagnoses: Term Pregnancy-delivered  Discharge Information: Date: 03/19/2011 Activity: pelvic rest Diet: routine Contraception: Depo-Provera Medications: Ibuprofen Condition: stable Instructions: refer to practice specific booklet Discharge to: home   Jaynie Collins, MD. 03/19/2011,9:12 AM

## 2011-03-20 MED ORDER — IBUPROFEN 600 MG PO TABS: 600.0000 mg | ORAL_TABLET | Freq: Four times a day (QID) | ORAL | Status: AC | PRN

## 2011-03-20 NOTE — Discharge Summary (Signed)
Obstetric Discharge Summary Reason for Admission: onset of labor Prenatal Procedures: ultrasound Intrapartum Procedures: spontaneous vaginal delivery Postpartum Procedures: none Complications-Operative and Postpartum: none Hemoglobin  Date Value Range Status  03/17/2011 11.9* 12.0-15.0 (g/dL) Final     HCT  Date Value Range Status  03/17/2011 36.5  36.0-46.0 (%) Final    Discharge Diagnoses: Term Pregnancy-delivered  Discharge Information: Date: 03/20/2011 Activity: pelvic rest Diet: routine Medications: Ibuprofen Condition: stable Instructions: ABC's of pregnancy Discharge to: home Follow-up Information    Follow up with Tereso Newcomer, MD on 04/16/2011. (9:15am)    Contact information:   [address] [place] [address] [phone]          Newborn Data: Live born female  Birth Weight: 7 lb 15.9 oz (3625 g) APGAR: 9, 9  Home with mother. F/u 6 weeks Gyn Khamani Daniely 03/20/2011, 1:06 PM

## 2011-03-20 NOTE — Progress Notes (Signed)
Infant and mother d/c home with family via ambulatory in private car. D/c instructions, prescriptions and follow-up for pt and baby reviewed pt verbalized understanding.

## 2011-03-23 NOTE — L&D Delivery Note (Signed)
Attestation of Attending Supervision of Obstetric Fellow: I was present during the vacuum assisted vaginal delivery, agree with delivery note.  Jaynie Collins, M.D. 03/01/2012 12:07 PM
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
## 2011-04-16 ENCOUNTER — Ambulatory Visit: Payer: Medicaid Other | Admitting: Obstetrics & Gynecology

## 2011-05-26 ENCOUNTER — Ambulatory Visit: Payer: Medicaid Other | Admitting: Obstetrics and Gynecology

## 2011-06-23 ENCOUNTER — Encounter: Payer: Self-pay | Admitting: Physician Assistant

## 2011-06-23 ENCOUNTER — Ambulatory Visit (INDEPENDENT_AMBULATORY_CARE_PROVIDER_SITE_OTHER): Payer: Self-pay | Admitting: Physician Assistant

## 2011-06-23 VITALS — BP 105/67 | HR 76 | Temp 99.2°F | Ht 62.0 in | Wt 135.5 lb

## 2011-06-23 DIAGNOSIS — Z01812 Encounter for preprocedural laboratory examination: Secondary | ICD-10-CM

## 2011-06-23 NOTE — Patient Instructions (Signed)
Contraception Choices Contraception (birth control) is the use of any methods or devices to prevent pregnancy. Below are some methods to help avoid pregnancy. HORMONAL METHODS   Contraceptive implant. This is a thin, plastic tube containing progesterone hormone. It does not contain estrogen hormone. Your caregiver inserts the tube in the inner part of the upper arm. The tube can remain in place for up to 3 years. After 3 years, the implant must be removed. The implant prevents the ovaries from releasing an egg (ovulation), thickens the cervical mucus which prevents sperm from entering the uterus, and thins the lining of the inside of the uterus.   Progesterone-only injections. These injections are given every 3 months by your caregiver to prevent pregnancy. This synthetic progesterone hormone stops the ovaries from releasing eggs. It also thickens cervical mucus and changes the uterine lining. This makes it harder for sperm to survive in the uterus.   Birth control pills. These pills contain estrogen and progesterone hormone. They work by stopping the egg from forming in the ovary (ovulation). Birth control pills are prescribed by a caregiver.Birth control pills can also be used to treat heavy periods.   Minipill. This type of birth control pill contains only the progesterone hormone. They are taken every day of each month and must be prescribed by your caregiver.   Birth control patch. The patch contains hormones similar to those in birth control pills. It must be changed once a week and is prescribed by a caregiver.   Vaginal ring. The ring contains hormones similar to those in birth control pills. It is left in the vagina for 3 weeks, removed for 1 week, and then a new one is put back in place. The patient must be comfortable inserting and removing the ring from the vagina.A caregiver's prescription is necessary.   Emergency contraception. Emergency contraceptives prevent pregnancy after  unprotected sexual intercourse. This pill can be taken right after sex or up to 5 days after unprotected sex. It is most effective the sooner you take the pills after having sexual intercourse. Emergency contraceptive pills are available without a prescription. Check with your pharmacist. Do not use emergency contraception as your only form of birth control.  BARRIER METHODS   Female condom. This is a thin sheath (latex or rubber) that is worn over the penis during sexual intercourse. It can be used with spermicide to increase effectiveness.   Female condom. This is a soft, loose-fitting sheath that is put into the vagina before sexual intercourse.   Diaphragm. This is a soft, latex, dome-shaped barrier that must be fitted by a caregiver. It is inserted into the vagina, along with a spermicidal jelly. It is inserted before intercourse. The diaphragm should be left in the vagina for 6 to 8 hours after intercourse.   Cervical cap. This is a round, soft, latex or plastic cup that fits over the cervix and must be fitted by a caregiver. The cap can be left in place for up to 48 hours after intercourse.   Sponge. This is a soft, circular piece of polyurethane foam. The sponge has spermicide in it. It is inserted into the vagina after wetting it and before sexual intercourse.   Spermicides. These are chemicals that kill or block sperm from entering the cervix and uterus. They come in the form of creams, jellies, suppositories, foam, or tablets. They do not require a prescription. They are inserted into the vagina with an applicator before having sexual intercourse. The process must be   repeated every time you have sexual intercourse.  INTRAUTERINE CONTRACEPTION  Intrauterine device (IUD). This is a T-shaped device that is put in a woman's uterus during a menstrual period to prevent pregnancy. There are 2 types:   Copper IUD. This type of IUD is wrapped in copper wire and is placed inside the uterus. Copper  makes the uterus and fallopian tubes produce a fluid that kills sperm. It can stay in place for 10 years.   Hormone IUD. This type of IUD contains the hormone progestin (synthetic progesterone). The hormone thickens the cervical mucus and prevents sperm from entering the uterus, and it also thins the uterine lining to prevent implantation of a fertilized egg. The hormone can weaken or kill the sperm that get into the uterus. It can stay in place for 5 years.  PERMANENT METHODS OF CONTRACEPTION  Female tubal ligation. This is when the woman's fallopian tubes are surgically sealed, tied, or blocked to prevent the egg from traveling to the uterus.   Female sterilization. This is when the female has the tubes that carry sperm tied off (vasectomy).This blocks sperm from entering the vagina during sexual intercourse. After the procedure, the man can still ejaculate fluid (semen).  NATURAL PLANNING METHODS  Natural family planning. This is not having sexual intercourse or using a barrier method (condom, diaphragm, cervical cap) on days the woman could become pregnant.   Calendar method. This is keeping track of the length of each menstrual cycle and identifying when you are fertile.   Ovulation method. This is avoiding sexual intercourse during ovulation.   Symptothermal method. This is avoiding sexual intercourse during ovulation, using a thermometer and ovulation symptoms.   Post-ovulation method. This is timing sexual intercourse after you have ovulated.  Regardless of which type or method of contraception you choose, it is important that you use condoms to protect against the transmission of sexually transmitted diseases (STDs). Talk with your caregiver about which form of contraception is most appropriate for you. Document Released: 03/08/2005 Document Revised: 02/25/2011 Document Reviewed: 07/15/2010 Exit[employer] Patient Information 2012 [employer].   Safer Sex Your caregiver wants you to have  this information about the infections that can be transmitted from sexual contact and how to prevent them. The idea behind safer sex is that you can be sexually active, and at the same time reduce the risk of giving or getting a sexually transmitted disease (STD). Every person should be aware of how to prevent him or herself and his or her sex partner from getting an STD. CAUSES OF STDS STDs are transmitted by sharing body fluids, which contain viruses and bacteria. The following fluids all transmit infections during sexual intercourse and sex acts:  Semen.   Saliva.   Urine.   Blood.   Vaginal mucus.  Examples of STDs include:  Chlamydia.   Gonorrhea.   Genital herpes.   Hepatitis B.   Human immunodeficiency virus or acquired immunodeficiency syndrome (HIV or AIDS).   Syphilis.   Trichomonas.   Pubic lice.   Human papillomavirus (HPV), which may include:   Genital warts.   Cervical dysplasia.   Cervical cancer (can develop with certain types of HPV).  SYMPTOMS  Sexual diseases often cause few or no symptoms until they are advanced, so a person can be infected and spread the infection without knowing it. Some STDs respond to treatment very well. Others, like HIV and herpes, cannot be cured, but are treated to reduce their effects. Specific symptoms include:  Abnormal   vaginal discharge.   Irritation or itching in and around the vagina, and in the pubic hair.   Pain during sexual intercourse.   Bleeding during sexual intercourse.   Pelvic or abdominal pain.   Fever.   Growths in and around the vagina.   An ulcer in or around the vagina.   Swollen glands in the groin area.  DIAGNOSIS   Blood tests.   Pap test.   Culture test of abnormal vaginal discharge.   A test that applies a solution and examines the cervix with a lighted magnifying scope (colposcopy).   A test that examines the pelvis with a lighted tube, through a small incision (laparoscopy).    TREATMENT  The treatment will depend on the cause of the STD.  Antibiotic treatment by injection, oral, creams, or suppositories in the vagina.   Over-the-counter medicated shampoo, to get rid of pubic lice.   Removing or treating growths with medicine, freezing, burning (electrocautery), or surgery.   Surgery treatment for HPV of the cervix.   Supportive medicines for herpes, HIV, AIDS, and hepatitis.  Being careful cannot eliminate all risk of infection, but sex can be made much safer. Safe sexual practices include body massage and gentle touching. Masturbation is safe, as long as body fluids do not contact skin that has sores or cuts. Dry kissing and oral sex on a man wearing a latex condom or on a woman wearing a female condom is also safe. Slightly less safe is intercourse while the man wears a latex condom or wet kissing. It is also safer to have one sex partner that you know is not having sex with anyone else. LENGTH OF ILLNESS An STD might be treated and cured in a week, sometimes a month, or more. And it can linger with symptoms for many years. STDs can also cause damage to the female organs. This can cause chronic pain, infertility, and recurrence of the STD, especially herpes, hepatitis, HIV, and HPV. HOME CARE INSTRUCTIONS AND PREVENTION  Alcohol and recreational drugs are often the reason given for not practicing safer sex. These substances affect your judgment. Alcohol and recreational drugs can also impair your immune system, making you more vulnerable to disease.   Do not engage in risky and dangerous sexual practices, including:   Vaginal or anal sex without a condom.   Oral sex on a man without a condom.   Oral sex on a woman without a female condom.   Using saliva to lubricate a condom.   Any other sexual contact in which body fluids or blood from one partner contact the other partner.   You should use only latex condoms for men and water soluble lubricants.  Petroleum based lubricants or oils used to lubricate a condom will weaken the condom and increase the chance that it will break.   Think very carefully before having sex with anyone who is high risk for STDs and HIV. This includes IV drug users, people with multiple sexual partners, or people who have had an STD, or a positive hepatitis or HIV blood test.   Remember that even if your partner has had only one previous partner, their previous partner might have had multiple partners. If so, you are at high risk of being exposed to an STD. You and your sex partner should be the only sex partners with each other, with no one else involved.   A vaccine is available for hepatitis B and HPV through your caregiver or the Public Health   Department. Everyone should be vaccinated with these vaccines.   Avoid risky sex practices. Sex acts that can break the skin make you more likely to get an STD.  SEEK MEDICAL CARE IF:   If you think you have an STD, even if you do not have any symptoms. Contact your caregiver for evaluation and treatment, if needed.   You think or know your sex partner has acquired an STD.   You have any of the symptoms mentioned above.  Document Released: 04/15/2004 Document Revised: 02/25/2011 Document Reviewed: 02/05/2009 ExitCare Patient Information 2012 [employer]. 

## 2011-06-23 NOTE — Progress Notes (Unsigned)
Chief Complaint:  Contraception   None     Tina Knox is  21 y.o. [id number].  Patient's last menstrual period was 06/06/2011.Marland Kitchen  Her pregnancy status is negative.  Desires to start Depo.  Pt is 3 months postpartum from an uncomplicated SVD. Is having regular 28 cycles with small to moderate blding lasting 5 days. LMP 06/06/2011. Pt is currently sexually active and using nothing for birth control. Last unprotected intercourse was 2 days ago.  Obstetrical/Gynecological History: OB History    Grav Para Term Preterm Abortions TAB SAB Ect Mult Living   2 2 2       2       Past Medical History: Past Medical History  Diagnosis Date  . Anemia     Past Surgical History: Past Surgical History  Procedure Date  . No past surgeries     Family History: History reviewed. No pertinent family history.  Social History: History  Substance Use Topics  . Smoking status: Current Some Day Smoker -- 0.5 packs/day    Types: Cigarettes  . Smokeless tobacco: Never Used  . Alcohol Use: No    Allergies: No Known Allergies  Review of Systems - Negative except what has been reviewed  Physical Exam   Blood pressure 105/67, pulse 76, temperature 99.2 F (37.3 C), temperature source Oral, height 5\' 2"  (1.575 m), weight 135 lb 8 oz (61.462 kg), last menstrual period 06/06/2011, not currently breastfeeding.  General: General appearance - alert, well appearing, and in no distress, oriented to person, place, and time and normal appearing weight Mental status - alert, oriented to person, place, and time, normal mood, behavior, speech, dress, motor activity, and thought processes, affect appropriate to mood Focused Gynecological Exam: examination not indicated  Labs: POCT Preg: negative   Assessment: Contraceptive Managment  Plan: Follow up in 2 weeks for repeat UPT. Abstinence til next visit. If neg UPT, will give Depo  Emari Demmer E. 06/23/2011,4:33 PM

## 2011-07-07 ENCOUNTER — Ambulatory Visit: Payer: Self-pay

## 2011-07-14 ENCOUNTER — Inpatient Hospital Stay (HOSPITAL_COMMUNITY)
Admission: AD | Admit: 2011-07-14 | Discharge: 2011-07-14 | Disposition: A | Discharge status: Home / Self Care | Payer: Medicaid Other | Source: Ambulatory Visit | Attending: Obstetrics & Gynecology | Admitting: Obstetrics & Gynecology

## 2011-07-14 ENCOUNTER — Inpatient Hospital Stay (HOSPITAL_COMMUNITY): Payer: Self-pay

## 2011-07-14 ENCOUNTER — Encounter (HOSPITAL_COMMUNITY): Payer: Self-pay | Admitting: *Deleted

## 2011-07-14 DIAGNOSIS — R109 Unspecified abdominal pain: Secondary | ICD-10-CM | POA: Insufficient documentation

## 2011-07-14 DIAGNOSIS — Z3490 Encounter for supervision of normal pregnancy, unspecified, unspecified trimester: Secondary | ICD-10-CM

## 2011-07-14 DIAGNOSIS — Z1389 Encounter for screening for other disorder: Secondary | ICD-10-CM

## 2011-07-14 DIAGNOSIS — O210 Mild hyperemesis gravidarum: Secondary | ICD-10-CM | POA: Insufficient documentation

## 2011-07-14 HISTORY — DX: Trichomoniasis, unspecified: A59.9

## 2011-07-14 LAB — URINALYSIS, ROUTINE W REFLEX MICROSCOPIC
Glucose, UA: NEGATIVE mg/dL
Hgb urine dipstick: NEGATIVE
Leukocytes, UA: NEGATIVE
Specific Gravity, Urine: 1.015 (ref 1.005–1.030)
Urobilinogen, UA: 1 mg/dL (ref 0.0–1.0)

## 2011-07-14 LAB — CBC
HCT: 35.1 % — ABNORMAL LOW (ref 36.0–46.0)
MCV: 82.8 fL (ref 78.0–100.0)
Platelets: 270 10*3/uL (ref 150–400)
RBC: 4.24 MIL/uL (ref 3.87–5.11)
WBC: 6.3 10*3/uL (ref 4.0–10.5)

## 2011-07-14 LAB — WET PREP, GENITAL

## 2011-07-14 MED ORDER — ONDANSETRON 8 MG PO TBDP
8.0000 mg | ORAL_TABLET | Freq: Once | ORAL | Status: AC
  Administered 2011-07-14: 8 mg via ORAL
  Filled 2011-07-14: qty 1

## 2011-07-14 MED ORDER — ONDANSETRON 8 MG PO TBDP: 8.0000 mg | ORAL_TABLET | Freq: Three times a day (TID) | ORAL | Status: AC | PRN

## 2011-07-14 NOTE — MAU Note (Signed)
Patient states she does not remember the date of her abortion. Did not go back for post op appointment. Was seen in clinic downstairs on 4/3. States abortion was a few days before her visit. States she did not tell her provider she had had an abortion. Was seeking birth control. UPT was negative on that day. Was told to return for repeat UPT in 2 weeks. If still negative, she would get Depo. Did not go back to clinic as advised. Had unprotected sex. Does not want to be pregnant. Has no valid reasons for noncompliance with appointments and other instructions.

## 2011-07-14 NOTE — MAU Provider Note (Signed)
History     CSN: [id number]  Arrival date and time: 07/14/11 1357   First Provider Initiated Contact with Patient 07/14/11 1456      Chief Complaint  Patient presents with  . Possible Pregnancy  . Abdominal Pain   HPI Tina Knox 20 y.o.    OB History    Grav Para Term Preterm Abortions TAB SAB Ect Mult Living   3 2 2  1 1    2       Past Medical History  Diagnosis Date  . Anemia   . Trichomonas     Past Surgical History  Procedure Date  . Induced abortion     History reviewed. No pertinent family history.  History  Substance Use Topics  . Smoking status: Current Everyday Smoker -- 0.2 packs/day    Types: Cigarettes  . Smokeless tobacco: Never Used  . Alcohol Use: No    Allergies: No Known Allergies  No prescriptions prior to admission    Review of Systems  Gastrointestinal: Positive for nausea and vomiting. Negative for diarrhea.  Genitourinary: Negative for dysuria.   Physical Exam   Blood pressure 110/64, pulse 84, temperature 97.7 F (36.5 C), temperature source Oral, resp. rate 16, height 5\' 3"  (1.6 m), weight 135 lb 9.6 oz (61.508 kg), last menstrual period 06/06/2011, SpO2 100.00%, unknown if currently breastfeeding.  Physical Exam  Nursing note and vitals reviewed. Constitutional: She is oriented to person, place, and time. She appears well-developed and well-nourished.  HENT:  Head: Normocephalic.  Eyes: EOM are normal.  Neck: Neck supple.  GI: Soft. There is no tenderness.  Genitourinary:       Speculum exam: Vagina - Small amount of creamy discharge, no odor Cervix - No contact bleeding Bimanual exam: Cervix closed Uterus non tender, 6 weeks size Adnexa non tender, no masses bilaterally GC/Chlam, wet prep done Chaperone present for exam.  Musculoskeletal: Normal range of motion.  Neurological: She is alert and oriented to person, place, and time.  Skin: Skin is warm and dry.  Psychiatric: She has a normal mood and affect.     MAU Course  Procedures  MDM Results for orders placed during the hospital encounter of 07/14/11 (from the past 24 hour(s))  URINALYSIS, ROUTINE W REFLEX MICROSCOPIC     Status: Abnormal   Collection Time   07/14/11  2:35 PM      Component Value Range   Color, Urine YELLOW  YELLOW    APPearance CLEAR  CLEAR    Specific Gravity, Urine 1.015  1.005 - 1.030    pH 7.0  5.0 - 8.0    Glucose, UA NEGATIVE  NEGATIVE (mg/dL)   Hgb urine dipstick NEGATIVE  NEGATIVE    Bilirubin Urine NEGATIVE  NEGATIVE    Ketones, ur 40 (*) NEGATIVE (mg/dL)   Protein, ur NEGATIVE  NEGATIVE (mg/dL)   Urobilinogen, UA 1.0  0.0 - 1.0 (mg/dL)   Nitrite NEGATIVE  NEGATIVE    Leukocytes, UA NEGATIVE  NEGATIVE   CBC     Status: Abnormal   Collection Time   07/14/11  2:49 PM      Component Value Range   WBC 6.3  4.0 - 10.5 (K/uL)   RBC 4.24  3.87 - 5.11 (MIL/uL)   Hemoglobin 11.8 (*) 12.0 - 15.0 (g/dL)   HCT 40.9 (*) 81.1 - 46.0 (%)   MCV 82.8  78.0 - 100.0 (fL)   MCH 27.8  26.0 - 34.0 (pg)   MCHC 33.6  30.0 -  36.0 (g/dL)   RDW 86.5  78.4 - 69.6 (%)   Platelets 270  150 - 400 (K/uL)  HCG, QUANTITATIVE, PREGNANCY     Status: Abnormal   Collection Time   07/14/11  2:49 PM      Component Value Range   hCG, Beta Chain, Quant, S 27219 (*) <5 (mIU/mL)  WET PREP, GENITAL     Status: Abnormal   Collection Time   07/14/11  3:03 PM      Component Value Range   Yeast Wet Prep HPF POC NONE SEEN  NONE SEEN    Trich, Wet Prep NONE SEEN  NONE SEEN    Clue Cells Wet Prep HPF POC FEW (*) NONE SEEN    WBC, Wet Prep HPF POC FEW (*) NONE SEEN    Ultrasound Living IUP at 5w 3d with [facility] of 03-12-12  Assessment and Plan  Morning sickness  Plan Your pregnancy test is positive.  No smoking, no drugs, no alcohol.  Take a prenatal vitamin one by mouth every day.  Eat small frequent snacks to avoid nausea.  Begin prenatal care as soon as possible. rx zofran 8 mg ODT sublingual q 8 hours as needed for  vomiting  Tina Knox 07/14/2011, 3:06 PM

## 2011-07-14 NOTE — MAU Note (Signed)
Patient states she has had unprotected sex since abortion. Does not want to be pregnant.

## 2011-07-14 NOTE — MAU Note (Signed)
Patient states she had a SVD on 12-27. Had a termination of a pregnancy about 3 weeks ago. Has been having abdominal pain and had a positive home pregnancy test last night. White vaginal discharge, nausea and vomiting last night and today.

## 2011-07-14 NOTE — Discharge Instructions (Signed)
Your pregnancy test is positive.  No smoking, no drugs, no alcohol.  Take a prenatal vitamin one by mouth every day.  Eat small frequent snacks to avoid nausea.  Begin prenatal care as soon as possible. 

## 2011-07-14 NOTE — MAU Note (Signed)
Patient now states that she thinks she remembers having an abortion the first part of March. She "thinks" she remembers going.

## 2011-07-15 LAB — GC/CHLAMYDIA PROBE AMP, GENITAL
Chlamydia, DNA Probe: NEGATIVE
GC Probe Amp, Genital: NEGATIVE

## 2011-07-15 NOTE — MAU Provider Note (Signed)
Attestation of Attending Supervision of Advanced Practitioner: Evaluation and management procedures were performed by the Advanced Pain Management Fellow/PA/CNM/NP under my supervision and collaboration. Chart reviewed, and agree with management and plan.  Jaynie Collins, M.D. 07/15/2011 11:00 AM

## 2011-07-21 ENCOUNTER — Ambulatory Visit: Payer: Self-pay

## 2011-11-12 ENCOUNTER — Inpatient Hospital Stay (HOSPITAL_COMMUNITY)
Admission: AD | Admit: 2011-11-12 | Discharge: 2011-11-12 | Disposition: A | Discharge status: Home / Self Care | Payer: Medicaid Other | Source: Ambulatory Visit | Attending: Obstetrics & Gynecology | Admitting: Obstetrics & Gynecology

## 2011-11-12 ENCOUNTER — Encounter (HOSPITAL_COMMUNITY): Payer: Self-pay | Admitting: *Deleted

## 2011-11-12 DIAGNOSIS — K047 Periapical abscess without sinus: Secondary | ICD-10-CM

## 2011-11-12 DIAGNOSIS — O99891 Other specified diseases and conditions complicating pregnancy: Secondary | ICD-10-CM | POA: Insufficient documentation

## 2011-11-12 DIAGNOSIS — R109 Unspecified abdominal pain: Secondary | ICD-10-CM | POA: Insufficient documentation

## 2011-11-12 DIAGNOSIS — K089 Disorder of teeth and supporting structures, unspecified: Secondary | ICD-10-CM | POA: Insufficient documentation

## 2011-11-12 DIAGNOSIS — N949 Unspecified condition associated with female genital organs and menstrual cycle: Secondary | ICD-10-CM

## 2011-11-12 LAB — URINALYSIS, ROUTINE W REFLEX MICROSCOPIC
Bilirubin Urine: NEGATIVE
Glucose, UA: NEGATIVE mg/dL
Hgb urine dipstick: NEGATIVE
Specific Gravity, Urine: 1.01 (ref 1.005–1.030)
Urobilinogen, UA: 0.2 mg/dL (ref 0.0–1.0)
pH: 7.5 (ref 5.0–8.0)

## 2011-11-12 MED ORDER — CEPHALEXIN 500 MG PO CAPS: 500.0000 mg | ORAL_CAPSULE | Freq: Three times a day (TID) | ORAL | Status: AC

## 2011-11-12 NOTE — MAU Note (Signed)
Pt states x2-3 days has had lower abd pain, heard bone po yesterday when standing up. Also has toothache since last delivery. Pain is intermittent, however noted more pain last pm. Feels like there is a cyst on her gumline next to the tooth. Has gargled with salt water.

## 2011-11-12 NOTE — MAU Provider Note (Signed)
Chief Complaint:  Dental Pain and Abdominal Pain    First Provider Initiated Contact with Patient 11/12/11 1056      Tina Knox is  20 y.o. [id number].  Patient's last menstrual period was 06/06/2011..  [redacted]w[redacted]d  She presents complaining of Dental Pain and Abdominal Pain . Onset is described as ongoing and has been present for  2-3 days. Pt states x2-3 days has had lower abd pain, heard bone po yesterday when standing up. Also has toothache since last delivery. Pain is intermittent, however noted more pain last pm. Feels like there is a cyst on her gumline next to the tooth. Has gargled with salt water.   Obstetrical/Gynecological History: OB History    Grav Para Term Preterm Abortions TAB SAB Ect Mult Living   4 2 2  1 1    2       Past Medical History: Past Medical History  Diagnosis Date  . Anemia   . Trichomonas     Past Surgical History: Past Surgical History  Procedure Date  . Induced abortion     Family History: Family History  Problem Relation Age of Onset  . Other Neg Hx     Social History: History  Substance Use Topics  . Smoking status: Current Everyday Smoker -- 0.2 packs/day    Types: Cigarettes  . Smokeless tobacco: Former Neurosurgeon    Quit date: 11/05/2011  . Alcohol Use: No    Allergies: No Known Allergies  Prescriptions prior to admission  Medication Sig Dispense Refill  . acetaminophen (TYLENOL) 500 MG tablet Take 500 mg by mouth every 6 (six) hours as needed. Takes for pain      . IRON PO Take 1 tablet by mouth daily.      . Prenatal Vit-Fe Fumarate-FA (PRENATAL MULTIVITAMIN) TABS Take 1 tablet by mouth every morning.        Review of Systems - History obtained from the patient General ROS: negative for - chills or fever ENT ROS: positive for - oral lesions and tooth pain negative for - headaches, sinus pain or sore throat Breast ROS: negative Respiratory ROS: no cough, shortness of breath, or wheezing Cardiovascular ROS: no chest pain or  dyspnea on exertion Gastrointestinal ROS: positive for - right sided groin pain with movement negative for - abdominal pain, change in bowel habits or nausea/vomiting Genito-Urinary ROS: no dysuria, trouble voiding, or hematuria negative for - vulvar/vaginal symptoms  Physical Exam   Blood pressure 101/65, pulse 100, temperature 98.1 F (36.7 C), temperature source Oral, resp. rate 16, height 5\' 2"  (1.575 m), weight 142 lb 2 oz (64.467 kg), last menstrual period 06/06/2011, unknown if currently breastfeeding.  General: General appearance - alert, well appearing, and in no distress and oriented to person, place, and time Mental status - alert, oriented to person, place, and time, normal mood, behavior, speech, dress, motor activity, and thought processes, affect appropriate to mood Mouth - cracked molar, back lower left with small flat lesion adjacent. No inflamation or s/s infection or abcess Abdomen - gravid, non tender size c/w dates, pain with palpation of round/broad ligament. No rebound, guarding, neg psoas Extremities - peripheral pulses normal, no pedal edema, no clubbing or cyanosis Focused Gynecological Exam: CERVIX: closed/long/thick/post  Labs: Recent Results (from the past 24 hour(s))  URINALYSIS, ROUTINE W REFLEX MICROSCOPIC   Collection Time   11/12/11 10:30 AM      Component Value Range   Color, Urine YELLOW  YELLOW   APPearance CLEAR  CLEAR  Specific Gravity, Urine 1.010  1.005 - 1.030   pH 7.5  5.0 - 8.0   Glucose, UA NEGATIVE  NEGATIVE mg/dL   Hgb urine dipstick NEGATIVE  NEGATIVE   Bilirubin Urine NEGATIVE  NEGATIVE   Ketones, ur NEGATIVE  NEGATIVE mg/dL   Protein, ur NEGATIVE  NEGATIVE mg/dL   Urobilinogen, UA 0.2  0.0 - 1.0 mg/dL   Nitrite NEGATIVE  NEGATIVE   Leukocytes, UA NEGATIVE  NEGATIVE    Assessment: Round Ligament Paint Probable early dental abscess   Plan: Discharge home Rx keflex for probably early dental abcess Continue warm salt water  swishes and [facility] referral and [facility] referral given.  Paxten Appelt E. 11/12/2011,11:33 AM

## 2011-11-12 NOTE — MAU Provider Note (Signed)
Attestation of Attending Supervision of Advanced Practitioner (CNM/NP): Evaluation and management procedures were performed by the Advanced Practitioner under my supervision and collaboration.  I have reviewed the Advanced Practitioner's note and chart, and I agree with the management and plan.  HARRAWAY-SMITH, Emelynn Rance 3:33 PM     

## 2011-11-18 ENCOUNTER — Ambulatory Visit (HOSPITAL_COMMUNITY)
Admit: 2011-11-18 | Discharge: 2011-11-18 | Disposition: A | Discharge status: Home / Self Care | Payer: Medicaid Other | Attending: Physician Assistant | Admitting: Physician Assistant

## 2011-11-18 ENCOUNTER — Encounter: Payer: Self-pay | Admitting: Physician Assistant

## 2011-11-18 DIAGNOSIS — K047 Periapical abscess without sinus: Secondary | ICD-10-CM

## 2011-11-18 DIAGNOSIS — Z3480 Encounter for supervision of other normal pregnancy, unspecified trimester: Secondary | ICD-10-CM | POA: Insufficient documentation

## 2011-11-18 DIAGNOSIS — N949 Unspecified condition associated with female genital organs and menstrual cycle: Secondary | ICD-10-CM

## 2011-12-05 ENCOUNTER — Inpatient Hospital Stay (HOSPITAL_COMMUNITY)
Admission: AD | Admit: 2011-12-05 | Discharge: 2011-12-05 | Disposition: A | Discharge status: Home / Self Care | Payer: Medicaid Other | Source: Ambulatory Visit | Attending: Obstetrics & Gynecology | Admitting: Obstetrics & Gynecology

## 2011-12-05 ENCOUNTER — Encounter (HOSPITAL_COMMUNITY): Payer: Self-pay | Admitting: *Deleted

## 2011-12-05 DIAGNOSIS — O0930 Supervision of pregnancy with insufficient antenatal care, unspecified trimester: Secondary | ICD-10-CM

## 2011-12-05 DIAGNOSIS — O99891 Other specified diseases and conditions complicating pregnancy: Secondary | ICD-10-CM | POA: Insufficient documentation

## 2011-12-05 DIAGNOSIS — M899 Disorder of bone, unspecified: Secondary | ICD-10-CM

## 2011-12-05 DIAGNOSIS — R109 Unspecified abdominal pain: Secondary | ICD-10-CM | POA: Insufficient documentation

## 2011-12-05 DIAGNOSIS — N949 Unspecified condition associated with female genital organs and menstrual cycle: Secondary | ICD-10-CM

## 2011-12-05 DIAGNOSIS — Z3480 Encounter for supervision of other normal pregnancy, unspecified trimester: Secondary | ICD-10-CM

## 2011-12-05 LAB — WET PREP, GENITAL
Clue Cells Wet Prep HPF POC: NONE SEEN
Trich, Wet Prep: NONE SEEN
Yeast Wet Prep HPF POC: NONE SEEN

## 2011-12-05 LAB — URINALYSIS, ROUTINE W REFLEX MICROSCOPIC
Bilirubin Urine: NEGATIVE
Glucose, UA: NEGATIVE mg/dL
Hgb urine dipstick: NEGATIVE
Ketones, ur: NEGATIVE mg/dL
Protein, ur: NEGATIVE mg/dL
pH: 8 (ref 5.0–8.0)

## 2011-12-05 NOTE — MAU Provider Note (Signed)
I examined pt and agree with documentation above and resident plan of care. MUHAMMAD,Mendell Bontempo  

## 2011-12-05 NOTE — MAU Note (Signed)
Pt reports she is having pain in her lower abd when she walks or moves. Has had pain for several weeks and had not gotten any better.

## 2011-12-05 NOTE — MAU Note (Signed)
Pt reports having very bad vaginal itching x 2 days. Having a small amount of white discharge as well.

## 2011-12-05 NOTE — MAU Provider Note (Signed)
Chief Complaint:  Abdominal Pain   First Provider Initiated Contact with Patient 12/05/11 1306      HPI: Tina Knox is a 21 y.o. [id number] at [redacted]w[redacted]d with no prenatal care who presents to maternity admissions reporting significant pelvic pain when walking.  States she has had pain on the left side and midline for the last several weeks but it has seemed to worsen recently.  Only bothers her with walking or when she rolls onto her left side. Otherwise she feels fine. Has not taken anything for it.    Denies contractions, leakage of fluid or vaginal bleeding. Good fetal movement. No fevers, chills, abnormal vaginal discharge or other concerns.    Pregnancy Course:   Past Medical History: Past Medical History  Diagnosis Date  . Anemia   . Trichomonas     Past obstetric history:     OB History    Grav Para Term Preterm Abortions TAB SAB Ect Mult Living   4 2 2  1 1    2      # Outc Date GA Lbr Len/2nd Wgt Sex Del Anes PTL Lv   1 TRM 6/09 [redacted]w[redacted]d   F SVD None  Yes   2 TRM 12/12 [redacted]w[redacted]d 13:30 / 00:34 7lb15.9oz(3.625kg) M SVD None  Yes   Comments: Normal   3 TAB            4 CUR               Past Surgical History: Past Surgical History  Procedure Date  . Induced abortion     Family History: Family History  Problem Relation Age of Onset  . Other Neg Hx     Social History: History  Substance Use Topics  . Smoking status: Current Every Day Smoker -- 0.2 packs/day    Types: Cigarettes  . Smokeless tobacco: Former Neurosurgeon    Quit date: 11/05/2011  . Alcohol Use: No    Allergies: No Known Allergies  Meds:  Prescriptions prior to admission  Medication Sig Dispense Refill  . acetaminophen (TYLENOL) 500 MG tablet Take 500 mg by mouth every 6 (six) hours as needed. Takes for pain      . IRON PO Take 1 tablet by mouth daily.      . Prenatal Vit-Fe Fumarate-FA (PRENATAL MULTIVITAMIN) TABS Take 1 tablet by mouth every morning.        ROS: Pertinent findings in history of  present illness.  Physical Exam  Blood pressure 117/63, pulse 80, temperature 98 F (36.7 C), temperature source Oral, resp. rate 18, height 5\' 2"  (1.575 m), weight 66.134 kg (145 lb 12.8 oz), last menstrual period 06/06/2011. GENERAL: Well-developed, well-nourished female in no acute distress.  HEENT: normocephalic HEART: normal rate RESP: normal effort ABDOMEN: Soft, non-tender, gravid appropriate for gestational age. TTP over pubic symphysis and left round ligament.  Pain identical to what she experiences when she walks.  EXTREMITIES: Nontender, no edema NEURO: alert and oriented SVE: closed/thick/high.  No CMT.      FHTones:  Baseline 140s Contractions: quiet   Labs: Results for orders placed during the hospital encounter of 12/05/11 (from the past 24 hour(s))  URINALYSIS, ROUTINE W REFLEX MICROSCOPIC     Status: Normal   Collection Time   12/05/11 12:50 PM      Component Value Range   Color, Urine YELLOW  YELLOW   APPearance CLEAR  CLEAR   Specific Gravity, Urine 1.015  1.005 - 1.030   pH 8.0  5.0 - 8.0   Glucose, UA NEGATIVE  NEGATIVE mg/dL   Hgb urine dipstick NEGATIVE  NEGATIVE   Bilirubin Urine NEGATIVE  NEGATIVE   Ketones, ur NEGATIVE  NEGATIVE mg/dL   Protein, ur NEGATIVE  NEGATIVE mg/dL   Urobilinogen, UA 0.2  0.0 - 1.0 mg/dL   Nitrite NEGATIVE  NEGATIVE   Leukocytes, UA NEGATIVE  NEGATIVE    Imaging:  US Ob Detail + 14 Wk  11/18/2011  OBSTETRICAL ULTRASOUND: This exam was performed within a [facility]. The OB US report was generated in the [facility] system, and faxed to the ordering physician.   This report is also available in [employer] and in the [employer]. See [facility] Obstetric US report.   ED Course   Assessment: 1. Pubic bone pain   2. Supervision of other normal pregnancy   3. Insufficient prenatal care   4. Round ligament pain     Plan:  1) pubic symphysis and round ligament pain - exam with reproducible  pain - discussed starting with a regular regimen of 1000mg  of tylenol q8 hours  - sent wet prep (negative) and GC/CHl today  - SVE closed/thick/high   Urged importance of following up with the medicaid office and then following up for [facility].   Discharge home Labor precautions and fetal kick counts discussed.     Medication List     As of 12/05/2011  1:30 PM    ASK your doctor about these medications         acetaminophen 500 MG tablet   Commonly known as: TYLENOL   Take 500 mg by mouth every 6 (six) hours as needed. Takes for pain      IRON PO   Take 1 tablet by mouth daily.      prenatal multivitamin Tabs   Take 1 tablet by mouth every morning.         Rulon Abide Medical Resident 12/05/2011 1:30 PM

## 2012-01-20 ENCOUNTER — Encounter: Payer: Self-pay | Admitting: Family Medicine

## 2012-01-20 ENCOUNTER — Ambulatory Visit (INDEPENDENT_AMBULATORY_CARE_PROVIDER_SITE_OTHER): Payer: Medicaid Other | Admitting: Family Medicine

## 2012-01-20 ENCOUNTER — Other Ambulatory Visit (HOSPITAL_COMMUNITY)
Admission: RE | Admit: 2012-01-20 | Discharge: 2012-01-20 | Disposition: A | Discharge status: Home / Self Care | Payer: Medicaid Other | Source: Ambulatory Visit | Attending: Family Medicine | Admitting: Family Medicine

## 2012-01-20 VITALS — BP 115/70 | Temp 96.6°F | Wt 154.6 lb

## 2012-01-20 DIAGNOSIS — O0930 Supervision of pregnancy with insufficient antenatal care, unspecified trimester: Secondary | ICD-10-CM

## 2012-01-20 DIAGNOSIS — Z3480 Encounter for supervision of other normal pregnancy, unspecified trimester: Secondary | ICD-10-CM

## 2012-01-20 DIAGNOSIS — O99330 Smoking (tobacco) complicating pregnancy, unspecified trimester: Secondary | ICD-10-CM | POA: Insufficient documentation

## 2012-01-20 DIAGNOSIS — Z01419 Encounter for gynecological examination (general) (routine) without abnormal findings: Secondary | ICD-10-CM | POA: Insufficient documentation

## 2012-01-20 LAB — POCT URINALYSIS DIP (DEVICE)
Bilirubin Urine: NEGATIVE
Hgb urine dipstick: NEGATIVE
Leukocytes, UA: NEGATIVE
Nitrite: NEGATIVE
Protein, ur: NEGATIVE mg/dL
pH: 7 (ref 5.0–8.0)

## 2012-01-20 LAB — CBC
MCHC: 33.3 g/dL (ref 30.0–36.0)
Platelets: 206 10*3/uL (ref 150–400)
RDW: 13 % (ref 11.5–15.5)
WBC: 7.5 10*3/uL (ref 4.0–10.5)

## 2012-01-20 MED ORDER — TETANUS-DIPHTH-ACELL PERTUSSIS 5-2.5-18.5 LF-MCG/0.5 IM SUSP
0.5000 mL | Freq: Once | INTRAMUSCULAR | Status: AC
  Administered 2012-01-20: 0.5 mL via INTRAMUSCULAR

## 2012-01-20 MED ORDER — INFLUENZA VIRUS VACC SPLIT PF IM SUSP
0.5000 mL | Freq: Once | INTRAMUSCULAR | Status: AC
  Administered 2012-01-20: 0.5 mL via INTRAMUSCULAR

## 2012-01-20 NOTE — Progress Notes (Signed)
   Subjective:    Tina Knox is a [id number] [redacted]w[redacted]d being seen today for her first obstetrical visit.  Her obstetrical history is significant for smoker and late to care. Patient does not intend to breast feed. Pregnancy history fully reviewed.  Patient reports no bleeding and no contractions.  Filed Vitals:   01/20/12 0934  BP: 115/70  Temp: 96.6 F (35.9 C)  Weight: 154 lb 9.6 oz (70.126 kg)    HISTORY: OB History    Grav Para Term Preterm Abortions TAB SAB Ect Mult Living   4 2 2  1 1    2      # Outc Date GA Lbr Len/2nd Wgt Sex Del Anes PTL Lv   1 TRM 6/09 [redacted]w[redacted]d  8lb1oz(3.657kg) F SVD None  Yes   2 TRM 12/12 [redacted]w[redacted]d 13:30 / 00:34 7lb15.9oz(3.625kg) M SVD None  Yes   Comments: Normal   3 TAB 2013           4 CUR              Past Medical History  Diagnosis Date  . Trichomonas   . Anemia     with last pregnancy   Past Surgical History  Procedure Date  . Induced abortion    Family History  Problem Relation Age of Onset  . Other Neg Hx      Exam    Uterus:     Pelvic Exam:    Perineum: Normal Perineum   Vulva: Bartholin's, Urethra, Skene's normal   Vagina:  normal mucosa, normal discharge       Cervix: multiparous appearance, no cervical motion tenderness and no lesions   Adnexa: not evaluated   Bony Pelvis: average  System: Breast:  normal appearance, no masses or tenderness   Skin: normal coloration and turgor, no rashes    Neurologic: oriented   Extremities: normal strength, tone, and muscle mass   HEENT sclera clear, anicteric   Mouth/Teeth mucous membranes moist, pharynx normal without lesions   Neck supple   Cardiovascular: regular rate and rhythm   Respiratory:  appears well, vitals normal, no respiratory distress, acyanotic, normal RR, ear and throat exam is normal, neck free of mass or lymphadenopathy, chest clear, no wheezing, crepitations, rhonchi, normal symmetric air entry   Abdomen: soft, non-tender; bowel sounds normal; no masses,  no  organomegaly          Assessment:    Pregnancy: [id number] Patient Active Problem List  Diagnosis  . Anemia  . Insufficient prenatal care  . Trichomoniasis of vagina  . Marijuana smoker  . Supervision of other normal pregnancy  . Tobacco use complicating pregnancy        Plan:     Initial labs drawn and glucola. Prenatal vitamins. Problem list reviewed and updated. Genetic Screening discussed First Screen: too late.  Ultrasound discussed; fetal survey: results reviewed.  Follow up in 2 weeks. Pap smear today.   Deciding about BTL. Flu and TDaP today.   Tina Knox S 01/20/2012

## 2012-01-20 NOTE — Progress Notes (Signed)
Nutrition note: 1st visit consult Pt has gained 19.6# @ [redacted]w[redacted]d, which is wnl. Pt reports eating 2-3 meals & 4-5 snacks/d. Pt reports being nauseous occ. Pt is taking PNV. Pt given verbal & written education on general nutrition during pregnancy. Disc tips to decrease nausea. Disc importance of BF. Disc wt gain goals of 25-35# total or 1#/wk. Pt agrees to continue PNV. Pt does not receive WIC but plans to apply. Pt does not plan to BF but was open to at least trying 1 time or pumping at least 1 bottle. F/u if referred Blondell Reveal, MS, RD, LDN

## 2012-01-20 NOTE — Progress Notes (Signed)
P = 95 Occasional pain/pressure in lower abdomen; occasional LE edema

## 2012-01-20 NOTE — Patient Instructions (Addendum)
Smoking Cessation Quitting smoking is important to your health and has many advantages. However, it is not always easy to quit since nicotine is a very addictive drug. Often times, people try 3 times or more before being able to quit. This document explains the best ways for you to prepare to quit smoking. Quitting takes hard work and a lot of effort, but you can do it. ADVANTAGES OF QUITTING SMOKING  You will live longer, feel better, and live better.  Your body will feel the impact of quitting smoking almost immediately.  Within 20 minutes, blood pressure decreases. Your pulse returns to its normal level.  After 8 hours, carbon monoxide levels in the blood return to normal. Your oxygen level increases.  After 24 hours, the chance of having a heart attack starts to decrease. Your breath, hair, and body stop smelling like smoke.  After 48 hours, damaged nerve endings begin to recover. Your sense of taste and smell improve.  After 72 hours, the body is virtually free of nicotine. Your bronchial tubes relax and breathing becomes easier.  After 2 to 12 weeks, lungs can hold more air. Exercise becomes easier and circulation improves.  The risk of having a heart attack, stroke, cancer, or lung disease is greatly reduced.  After 1 year, the risk of coronary heart disease is cut in half.  After 5 years, the risk of stroke falls to the same as a nonsmoker.  After 10 years, the risk of lung cancer is cut in half and the risk of other cancers decreases significantly.  After 15 years, the risk of coronary heart disease drops, usually to the level of a nonsmoker.  If you are pregnant, quitting smoking will improve your chances of having a healthy baby.  The people you live with, especially any children, will be healthier.  You will have extra money to spend on things other than cigarettes. QUESTIONS TO THINK ABOUT BEFORE ATTEMPTING TO QUIT You may want to talk about your answers with your  caregiver.  Why do you want to quit?  If you tried to quit in the past, what helped and what did not?  What will be the most difficult situations for you after you quit? How will you plan to handle them?  Who can help you through the tough times? Your family? Friends? A caregiver?  What pleasures do you get from smoking? What ways can you still get pleasure if you quit? Here are some questions to ask your caregiver:  How can you help me to be successful at quitting?  What medicine do you think would be best for me and how should I take it?  What should I do if I need more help?  What is smoking withdrawal like? How can I get information on withdrawal? GET READY  Set a quit date.  Change your environment by getting rid of all cigarettes, ashtrays, matches, and lighters in your home, car, or work. Do not let people smoke in your home.  Review your past attempts to quit. Think about what worked and what did not. GET SUPPORT AND ENCOURAGEMENT You have a better chance of being successful if you have help. You can get support in many ways.  Tell your family, friends, and co-workers that you are going to quit and need their support. Ask them not to smoke around you.  Get individual, group, or telephone counseling and support. Programs are available at local hospitals and health centers. Call your local health department for   information about programs in your area.  Spiritual beliefs and practices may help some smokers quit.  Download a "quit meter" on your computer to keep track of quit statistics, such as how long you have gone without smoking, cigarettes not smoked, and money saved.  Get a self-help book about quitting smoking and staying off of tobacco. LEARN NEW SKILLS AND BEHAVIORS  Distract yourself from urges to smoke. Talk to someone, go for a walk, or occupy your time with a task.  Change your normal routine. Take a different route to work. Drink tea instead of coffee.  Eat breakfast in a different place.  Reduce your stress. Take a hot bath, exercise, or read a book.  Plan something enjoyable to do every day. Reward yourself for not smoking.  Explore interactive web-based programs that specialize in helping you quit. GET MEDICINE AND USE IT CORRECTLY Medicines can help you stop smoking and decrease the urge to smoke. Combining medicine with the above behavioral methods and support can greatly increase your chances of successfully quitting smoking.  Nicotine replacement therapy helps deliver nicotine to your body without the negative effects and risks of smoking. Nicotine replacement therapy includes nicotine gum, lozenges, inhalers, nasal sprays, and skin patches. Some may be available over-the-counter and others require a prescription.  Antidepressant medicine helps people abstain from smoking, but how this works is unknown. This medicine is available by prescription.  Nicotinic receptor partial agonist medicine simulates the effect of nicotine in your brain. This medicine is available by prescription. Ask your caregiver for advice about which medicines to use and how to use them based on your health history. Your caregiver will tell you what side effects to look out for if you choose to be on a medicine or therapy. Carefully read the information on the package. Do not use any other product containing nicotine while using a nicotine replacement product.  RELAPSE OR DIFFICULT SITUATIONS Most relapses occur within the first 3 months after quitting. Do not be discouraged if you start smoking again. Remember, most people try several times before finally quitting. You may have symptoms of withdrawal because your body is used to nicotine. You may crave cigarettes, be irritable, feel very hungry, cough often, get headaches, or have difficulty concentrating. The withdrawal symptoms are only temporary. They are strongest when you first quit, but they will go away within  10 14 days. To reduce the chances of relapse, try to:  Avoid drinking alcohol. Drinking lowers your chances of successfully quitting.  Reduce the amount of caffeine you consume. Once you quit smoking, the amount of caffeine in your body increases and can give you symptoms, such as a rapid heartbeat, sweating, and anxiety.  Avoid smokers because they can make you want to smoke.  Do not let weight gain distract you. Many smokers will gain weight when they quit, usually less than 10 pounds. Eat a healthy diet and stay active. You can always lose the weight gained after you quit.  Find ways to improve your mood other than smoking. FOR MORE INFORMATION  www.smokefree.gov  Document Released: 03/02/2001 Document Revised: 09/07/2011 Document Reviewed: 06/17/2011 [facility] Patient Information 2013 [place], [place].  Pregnancy - Third Trimester The third trimester of pregnancy (the last 3 months) is a period of the most rapid growth for you and your baby. The baby approaches a length of 20 inches and a weight of 6 to 10 pounds. The baby is adding on fat and getting ready for life outside your body. While  inside, babies have periods of sleeping and waking, suck their thumbs, and hiccups. You can often feel small contractions of the uterus. This is false labor. It is also called Braxton-Hicks contractions. This is like a practice for labor. The usual problems in this stage of pregnancy include more difficulty breathing, swelling of the hands and feet from water retention, and having to urinate more often because of the uterus and baby pressing on your bladder.  PRENATAL EXAMS  Blood work may continue to be done during prenatal exams. These tests are done to check on your health and the probable health of your baby. Blood work is used to follow your blood levels (hemoglobin). Anemia (low hemoglobin) is common during pregnancy. Iron and vitamins are given to help prevent this. You may also continue to be  checked for diabetes. Some of the past blood tests may be done again.  The size of the uterus is measured during each visit. This makes sure your baby is growing properly according to your pregnancy dates.  Your blood pressure is checked every prenatal visit. This is to make sure you are not getting toxemia.  Your urine is checked every prenatal visit for infection, diabetes and protein.  Your weight is checked at each visit. This is done to make sure gains are happening at the suggested rate and that you and your baby are growing normally.  Sometimes, an ultrasound is performed to confirm the position and the proper growth and development of the baby. This is a test done that bounces harmless sound waves off the baby so your caregiver can more accurately determine due dates.  Discuss the type of pain medication and anesthesia you will have during your labor and delivery.  Discuss the possibility and anesthesia if a Cesarean Section might be necessary.  Inform your caregiver if there is any mental or physical violence at home. Sometimes, a specialized non-stress test, contraction stress test and biophysical profile are done to make sure the baby is not having a problem. Checking the amniotic fluid surrounding the baby is called an amniocentesis. The amniotic fluid is removed by sticking a needle into the belly (abdomen). This is sometimes done near the end of pregnancy if an early delivery is required. In this case, it is done to help make sure the baby's lungs are mature enough for the baby to live outside of the womb. If the lungs are not mature and it is unsafe to deliver the baby, an injection of cortisone medication is given to the mother 1 to 2 days before the delivery. This helps the baby's lungs mature and makes it safer to deliver the baby. CHANGES OCCURING IN THE THIRD TRIMESTER OF PREGNANCY Your body goes through many changes during pregnancy. They vary from person to person. Talk to  your caregiver about changes you notice and are concerned about.  During the last trimester, you have probably had an increase in your appetite. It is normal to have cravings for certain foods. This varies from person to person and pregnancy to pregnancy.  You may begin to get stretch marks on your hips, abdomen, and breasts. These are normal changes in the body during pregnancy. There are no exercises or medications to take which prevent this change.  Constipation may be treated with a stool softener or adding bulk to your diet. Drinking lots of fluids, fiber in vegetables, fruits, and whole grains are helpful.  Exercising is also helpful. If you have been very active up until your  pregnancy, most of these activities can be continued during your pregnancy. If you have been less active, it is helpful to start an exercise program such as walking. Consult your caregiver before starting exercise programs.  Avoid all smoking, alcohol, un-prescribed drugs, herbs and "street drugs" during your pregnancy. These chemicals affect the formation and growth of the baby. Avoid chemicals throughout the pregnancy to ensure the delivery of a healthy infant.  Backache, varicose veins and hemorrhoids may develop or get worse.  You will tire more easily in the third trimester, which is normal.  The baby's movements may be stronger and more often.  You may become short of breath easily.  Your belly button may stick out.  A yellow discharge may leak from your breasts called colostrum.  You may have a bloody mucus discharge. This usually occurs a few days to a week before labor begins. HOME CARE INSTRUCTIONS   Keep your caregiver's appointments. Follow your caregiver's instructions regarding medication use, exercise, and diet.  During pregnancy, you are providing food for you and your baby. Continue to eat regular, well-balanced meals. Choose foods such as meat, fish, milk and other low fat dairy products,  vegetables, fruits, and whole-grain breads and cereals. Your caregiver will tell you of the ideal weight gain.  A physical sexual relationship may be continued throughout pregnancy if there are no other problems such as early (premature) leaking of amniotic fluid from the membranes, vaginal bleeding, or belly (abdominal) pain.  Exercise regularly if there are no restrictions. Check with your caregiver if you are unsure of the safety of your exercises. Greater weight gain will occur in the last 2 trimesters of pregnancy. Exercising helps:  Control your weight.  Get you in shape for labor and delivery.  You lose weight after you deliver.  Rest a lot with legs elevated, or as needed for leg cramps or low back pain.  Wear a good support or jogging bra for breast tenderness during pregnancy. This may help if worn during sleep. Pads or tissues may be used in the bra if you are leaking colostrum.  Do not use hot tubs, steam rooms, or saunas.  Wear your seat belt when driving. This protects you and your baby if you are in an accident.  Avoid raw meat, cat litter boxes and soil used by cats. These carry germs that can cause birth defects in the baby.  It is easier to loose urine during pregnancy. Tightening up and strengthening the pelvic muscles will help with this problem. You can practice stopping your urination while you are going to the bathroom. These are the same muscles you need to strengthen. It is also the muscles you would use if you were trying to stop from passing gas. You can practice tightening these muscles up 10 times a set and repeating this about 3 times per day. Once you know what muscles to tighten up, do not perform these exercises during urination. It is more likely to cause an infection by backing up the urine.  Ask for help if you have financial, counseling or nutritional needs during pregnancy. Your caregiver will be able to offer counseling for these needs as well as refer  you for other special needs.  Make a list of emergency phone numbers and have them available.  Plan on getting help from family or friends when you go home from the hospital.  Make a trial run to the hospital.  Take prenatal classes with the father to understand, practice  and ask questions about the labor and delivery.  Prepare the baby's room/nursery.  Do not travel out of the city unless it is absolutely necessary and with the advice of your caregiver.  Wear only low or no heal shoes to have better balance and prevent falling. MEDICATIONS AND DRUG USE IN PREGNANCY  Take prenatal vitamins as directed. The vitamin should contain 1 milligram of folic acid. Keep all vitamins out of reach of children. Only a couple vitamins or tablets containing iron may be fatal to a baby or young child when ingested.  Avoid use of all medications, including herbs, over-the-counter medications, not prescribed or suggested by your caregiver. Only take over-the-counter or prescription medicines for pain, discomfort, or fever as directed by your caregiver. Do not use aspirin, ibuprofen (Motrin, Advil, Nuprin) or naproxen (Aleve) unless OK'd by your caregiver.  Let your caregiver also know about herbs you may be using.  Alcohol is related to a number of birth defects. This includes fetal alcohol syndrome. All alcohol, in any form, should be avoided completely. Smoking will cause low birth rate and premature babies.  Street/illegal drugs are very harmful to the baby. They are absolutely forbidden. A baby born to an addicted mother will be addicted at birth. The baby will go through the same withdrawal an adult does. SEEK MEDICAL CARE IF: You have any concerns or worries during your pregnancy. It is better to call with your questions if you feel they cannot wait, rather than worry about them. DECISIONS ABOUT CIRCUMCISION You may or may not know the sex of your baby. If you know your baby is a boy, it may be  time to think about circumcision. Circumcision is the removal of the foreskin of the penis. This is the skin that covers the sensitive end of the penis. There is no proven medical need for this. Often this decision is made on what is popular at the time or based upon religious beliefs and social issues. You can discuss these issues with your caregiver or pediatrician. SEEK IMMEDIATE MEDICAL CARE IF:   An unexplained oral temperature above 102 F (38.9 C) develops, or as your caregiver suggests.  You have leaking of fluid from the vagina (birth canal). If leaking membranes are suspected, take your temperature and tell your caregiver of this when you call.  There is vaginal spotting, bleeding or passing clots. Tell your caregiver of the amount and how many pads are used.  You develop a bad smelling vaginal discharge with a change in the color from clear to white.  You develop vomiting that lasts more than 24 hours.  You develop chills or fever.  You develop shortness of breath.  You develop burning on urination.  You loose more than 2 pounds of weight or gain more than 2 pounds of weight or as suggested by your caregiver.  You notice sudden swelling of your face, hands, and feet or legs.  You develop belly (abdominal) pain. Round ligament discomfort is a common non-cancerous (benign) cause of abdominal pain in pregnancy. Your caregiver still must evaluate you.  You develop a severe headache that does not go away.  You develop visual problems, blurred or double vision.  If you have not felt your baby move for more than 1 hour. If you think the baby is not moving as much as usual, eat something with sugar in it and lie down on your left side for an hour. The baby should move at least 4 to 5 times  per hour. Call right away if your baby moves less than that.  You fall, are in a car accident or any kind of trauma.  There is mental or physical violence at home. Document Released:  03/02/2001 Document Revised: 05/31/2011 Document Reviewed: 09/04/2008 [facility] Patient Information 2013 [employer], [place].  Contraception Choices Contraception (birth control) is the use of any methods or devices to prevent pregnancy. Below are some methods to help avoid pregnancy. HORMONAL METHODS   Contraceptive implant. This is a thin, plastic tube containing progesterone hormone. It does not contain estrogen hormone. Your caregiver inserts the tube in the inner part of the upper arm. The tube can remain in place for up to 3 years. After 3 years, the implant must be removed. The implant prevents the ovaries from releasing an egg (ovulation), thickens the cervical mucus which prevents sperm from entering the uterus, and thins the lining of the inside of the uterus.  Progesterone-only injections. These injections are given every 3 months by your caregiver to prevent pregnancy. This synthetic progesterone hormone stops the ovaries from releasing eggs. It also thickens cervical mucus and changes the uterine lining. This makes it harder for sperm to survive in the uterus.  Birth control pills. These pills contain estrogen and progesterone hormone. They work by stopping the egg from forming in the ovary (ovulation). Birth control pills are prescribed by a caregiver.Birth control pills can also be used to treat heavy periods.  Minipill. This type of birth control pill contains only the progesterone hormone. They are taken every day of each month and must be prescribed by your caregiver.  Birth control patch. The patch contains hormones similar to those in birth control pills. It must be changed once a week and is prescribed by a caregiver.  Vaginal ring. The ring contains hormones similar to those in birth control pills. It is left in the vagina for 3 weeks, removed for 1 week, and then a new one is put back in place. The patient must be comfortable inserting and removing the ring from the vagina.A  caregiver's prescription is necessary.  Emergency contraception. Emergency contraceptives prevent pregnancy after unprotected sexual intercourse. This pill can be taken right after sex or up to 5 days after unprotected sex. It is most effective the sooner you take the pills after having sexual intercourse. Emergency contraceptive pills are available without a prescription. Check with your pharmacist. Do not use emergency contraception as your only form of birth control. BARRIER METHODS   Female condom. This is a thin sheath (latex or rubber) that is worn over the penis during sexual intercourse. It can be used with spermicide to increase effectiveness.  Female condom. This is a soft, loose-fitting sheath that is put into the vagina before sexual intercourse.  Diaphragm. This is a soft, latex, dome-shaped barrier that must be fitted by a caregiver. It is inserted into the vagina, along with a spermicidal jelly. It is inserted before intercourse. The diaphragm should be left in the vagina for 6 to 8 hours after intercourse.  Cervical cap. This is a round, soft, latex or plastic cup that fits over the cervix and must be fitted by a caregiver. The cap can be left in place for up to 48 hours after intercourse.  Sponge. This is a soft, circular piece of polyurethane foam. The sponge has spermicide in it. It is inserted into the vagina after wetting it and before sexual intercourse.  Spermicides. These are chemicals that kill or block sperm from entering  the cervix and uterus. They come in the form of creams, jellies, suppositories, foam, or tablets. They do not require a prescription. They are inserted into the vagina with an applicator before having sexual intercourse. The process must be repeated every time you have sexual intercourse. INTRAUTERINE CONTRACEPTION  Intrauterine device (IUD). This is a T-shaped device that is put in a woman's uterus during a menstrual period to prevent pregnancy. There are  2 types:  Copper IUD. This type of IUD is wrapped in copper wire and is placed inside the uterus. Copper makes the uterus and fallopian tubes produce a fluid that kills sperm. It can stay in place for 10 years.  Hormone IUD. This type of IUD contains the hormone progestin (synthetic progesterone). The hormone thickens the cervical mucus and prevents sperm from entering the uterus, and it also thins the uterine lining to prevent implantation of a fertilized egg. The hormone can weaken or kill the sperm that get into the uterus. It can stay in place for 5 years. PERMANENT METHODS OF CONTRACEPTION  Female tubal ligation. This is when the woman's fallopian tubes are surgically sealed, tied, or blocked to prevent the egg from traveling to the uterus.  Female sterilization. This is when the female has the tubes that carry sperm tied off (vasectomy).This blocks sperm from entering the vagina during sexual intercourse. After the procedure, the man can still ejaculate fluid (semen). NATURAL PLANNING METHODS  Natural family planning. This is not having sexual intercourse or using a barrier method (condom, diaphragm, cervical cap) on days the woman could become pregnant.  Calendar method. This is keeping track of the length of each menstrual cycle and identifying when you are fertile.  Ovulation method. This is avoiding sexual intercourse during ovulation.  Symptothermal method. This is avoiding sexual intercourse during ovulation, using a thermometer and ovulation symptoms.  Post-ovulation method. This is timing sexual intercourse after you have ovulated. Regardless of which type or method of contraception you choose, it is important that you use condoms to protect against the transmission of sexually transmitted diseases (STDs). Talk with your caregiver about which form of contraception is most appropriate for you. Document Released: 03/08/2005 Document Revised: 05/31/2011 Document Reviewed:  07/15/2010 [facility] Patient Information 2013 [place], [place].  Breastfeeding Deciding to breastfeed is one of the best choices you can make for you and your baby. The information that follows gives a brief overview of the benefits of breastfeeding as well as common topics surrounding breastfeeding. BENEFITS OF BREASTFEEDING For the baby  The first milk (colostrum) helps the baby's digestive system function better.   There are antibodies in the mother's milk that help the baby fight off infections.   The baby has a lower incidence of asthma, allergies, and sudden infant death syndrome (SIDS).   The nutrients in breast milk are better for the baby than infant formulas, and breast milk helps the baby's brain grow better.   Babies who breastfeed have less gas, colic, and constipation.  For the mother  Breastfeeding helps develop a very special bond between the mother and her baby.   Breastfeeding is convenient, always available at the correct temperature, and costs nothing.   Breastfeeding burns calories in the mother and helps her lose weight that was gained during pregnancy.   Breastfeeding makes the uterus contract back down to normal size faster and slows bleeding following delivery.   Breastfeeding mothers have a lower risk of developing breast cancer.  BREASTFEEDING FREQUENCY  A healthy, full-term baby may  breastfeed as often as every hour or space his or her feedings to every 3 hours.   Watch your baby for signs of hunger. Nurse your baby if he or she shows signs of hunger. How often you nurse will vary from baby to baby.   Nurse as often as the baby requests, or when you feel the need to reduce the fullness of your breasts.   Awaken the baby if it has been 3 4 hours since the last feeding.   Frequent feeding will help the mother make more milk and will help prevent problems, such as sore nipples and engorgement of the breasts.  BABY'S POSITION AT THE  BREAST  Whether lying down or sitting, be sure that the baby's tummy is facing your tummy.   Support the breast with 4 fingers underneath the breast and the thumb above. Make sure your fingers are well away from the nipple and baby's mouth.   Stroke the baby's lips gently with your finger or nipple.   When the baby's mouth is open wide enough, place all of your nipple and as much of the areola as possible into your baby's mouth.   Pull the baby in close so the tip of the nose and the baby's cheeks touch the breast during the feeding.  FEEDINGS AND SUCTION  The length of each feeding varies from baby to baby and from feeding to feeding.   The baby must suck about 2 3 minutes for your milk to get to him or her. This is called a "let down." For this reason, allow the baby to feed on each breast as long as he or she wants. Your baby will end the feeding when he or she has received the right balance of nutrients.   To break the suction, put your finger into the corner of the baby's mouth and slide it between his or her gums before removing your breast from his or her mouth. This will help prevent sore nipples.  HOW TO TELL WHETHER YOUR BABY IS GETTING ENOUGH BREAST MILK. Wondering whether or not your baby is getting enough milk is a common concern among mothers. You can be assured that your baby is getting enough milk if:   Your baby is actively sucking and you hear swallowing.   Your baby seems relaxed and satisfied after a feeding.   Your baby nurses at least 8 12 times in a 24 hour time period. Nurse your baby until he or she unlatches or falls asleep at the first breast (at least 10 20 minutes), then offer the second side.   Your baby is wetting 5 6 disposable diapers (6 8 cloth diapers) in a 24 hour period by 33 36 days of age.   Your baby is having at least 3 4 stools every 24 hours for the first 6 weeks. The stool should be soft and yellow.   Your baby should gain 4 7  ounces per week after he or she is 12 days old.   Your breasts feel softer after nursing.  REDUCING BREAST ENGORGEMENT  In the first week after your baby is born, you may experience signs of breast engorgement. When breasts are engorged, they feel heavy, warm, full, and may be tender to the touch. You can reduce engorgement if you:   Nurse frequently, every 2 3 hours. Mothers who breastfeed early and often have fewer problems with engorgement.   Place light ice packs on your breasts for 10 20 minutes between feedings.  This reduces swelling. Wrap the ice packs in a lightweight towel to protect your skin. Bags of frozen vegetables work well for this purpose.   Take a warm shower or apply warm, moist heat to your breast for 5 10 minutes just before each feeding. This increases circulation and helps the milk flow.   Gently massage your breast before and during the feeding. Using your finger tips, massage from the chest wall towards your nipple in a circular motion.   Make sure that the baby empties at least one breast at every feeding before switching sides.   Use a breast pump to empty the breasts if your baby is sleepy or not nursing well. You may also want to pump if you are returning to work oryou feel you are getting engorged.   Avoid bottle feeds, pacifiers, or supplemental feedings of water or juice in place of breastfeeding. Breast milk is all the food your baby needs. It is not necessary for your baby to have water or formula. In fact, to help your breasts make more milk, it is best not to give your baby supplemental feedings during the early weeks.   Be sure the baby is latched on and positioned properly while breastfeeding.   Wear a supportive bra, avoiding underwire styles.   Eat a balanced diet with enough fluids.   Rest often, relax, and take your prenatal vitamins to prevent fatigue, stress, and anemia.  If you follow these suggestions, your engorgement should  improve in 24 48 hours. If you are still experiencing difficulty, call your lactation consultant or caregiver.  CARING FOR YOURSELF Take care of your breasts  Bathe or shower daily.   Avoid using soap on your nipples.   Start feedings on your left breast at one feeding and on your right breast at the next feeding.   You will notice an increase in your milk supply 2 5 days after delivery. You may feel some discomfort from engorgement, which makes your breasts very firm and often tender. Engorgement "peaks" out within 24 48 hours. In the meantime, apply warm moist towels to your breasts for 5 10 minutes before feeding. Gentle massage and expression of some milk before feeding will soften your breasts, making it easier for your baby to latch on.   Wear a well-fitting nursing bra, and air dry your nipples for a 3 after each feeding.   Only use cotton bra pads.   Only use pure lanolin on your nipples after nursing. You do not need to wash it off before feeding the baby again. Another option is to express a few drops of breast milk and gently massage it into your nipples.  Take care of yourself  Eat well-balanced meals and nutritious snacks.   Drinking milk, fruit juice, and water to satisfy your thirst (about 8 glasses a day).   Get plenty of rest.  Avoid foods that you notice affect the baby in a bad way.  SEEK MEDICAL CARE IF:   You have difficulty with breastfeeding and need help.   You have a hard, red, sore area on your breast that is accompanied by a fever.   Your baby is too sleepy to eat well or is having trouble sleeping.   Your baby is wetting less than 6 diapers a day, by 36 days of age.   Your baby's skin or white part of his or her eyes is more yellow than it was in the hospital.   You feel depressed.  Document Released: 03/08/2005 Document Revised: 09/07/2011 Document Reviewed: 06/06/2011 [facility] Patient Information 2013 [place], [place].

## 2012-01-21 LAB — HIV ANTIBODY (ROUTINE TESTING W REFLEX): HIV: NONREACTIVE

## 2012-01-21 LAB — GLUCOSE TOLERANCE, 1 HOUR (50G) W/O FASTING: Glucose, 1 Hour GTT: 91 mg/dL (ref 70–140)

## 2012-01-22 ENCOUNTER — Encounter: Payer: Self-pay | Admitting: Family Medicine

## 2012-01-22 ENCOUNTER — Encounter (HOSPITAL_COMMUNITY): Payer: Self-pay | Admitting: Obstetrics and Gynecology

## 2012-01-22 ENCOUNTER — Inpatient Hospital Stay (HOSPITAL_COMMUNITY)
Admission: AD | Admit: 2012-01-22 | Discharge: 2012-01-22 | Disposition: A | Discharge status: Home / Self Care | Payer: Medicaid Other | Source: Ambulatory Visit | Attending: Family Medicine | Admitting: Family Medicine

## 2012-01-22 DIAGNOSIS — N949 Unspecified condition associated with female genital organs and menstrual cycle: Secondary | ICD-10-CM

## 2012-01-22 DIAGNOSIS — Z3480 Encounter for supervision of other normal pregnancy, unspecified trimester: Secondary | ICD-10-CM

## 2012-01-22 DIAGNOSIS — O99330 Smoking (tobacco) complicating pregnancy, unspecified trimester: Secondary | ICD-10-CM

## 2012-01-22 DIAGNOSIS — R109 Unspecified abdominal pain: Secondary | ICD-10-CM | POA: Insufficient documentation

## 2012-01-22 DIAGNOSIS — B309 Viral conjunctivitis, unspecified: Secondary | ICD-10-CM | POA: Insufficient documentation

## 2012-01-22 DIAGNOSIS — O99891 Other specified diseases and conditions complicating pregnancy: Secondary | ICD-10-CM | POA: Insufficient documentation

## 2012-01-22 LAB — URINALYSIS, ROUTINE W REFLEX MICROSCOPIC
Bilirubin Urine: NEGATIVE
Hgb urine dipstick: NEGATIVE
Ketones, ur: NEGATIVE mg/dL
Nitrite: NEGATIVE
Specific Gravity, Urine: 1.015 (ref 1.005–1.030)
Urobilinogen, UA: 0.2 mg/dL (ref 0.0–1.0)
pH: 7.5 (ref 5.0–8.0)

## 2012-01-22 LAB — CULTURE, OB URINE: Colony Count: 8000

## 2012-01-22 MED ORDER — KETOROLAC TROMETHAMINE 60 MG/2ML IM SOLN: 60.0000 mg | Freq: Once | INTRAMUSCULAR | Status: DC

## 2012-01-22 NOTE — MAU Note (Signed)
Pt c/o abd pain in lower stomach that aches when she moves. Also c/o itch burning and runny eyes for the past 3 days.

## 2012-01-22 NOTE — MAU Provider Note (Signed)
Chart reviewed and agree with management and plan.  

## 2012-01-22 NOTE — MAU Provider Note (Signed)
History   Tina Knox is a 21 y.o. [phone] female at [redacted]w[redacted]d who presents w/ report of bilateral eye redness, itching, and white discharge x 2-3 days.  She denies recent cold, had nasal congestion 4-5 days ago that has resolved.  Denies being around anyone w/ conjunctivitis. States she took a few of her Keflex that were left over from rx for dental infection.  Also has sharp achy bilateral lower abdominal pain with movement/change in position that has been going on 'for awhile', she has been taking apap prn w/ relief.  Denies urinary frequency, dysuria, hesitancy, urgency, hematuria.  Reports good fm, denies uc's, vb, or lof.    CSN: [id number]  Arrival date and time: 01/22/12 5784   First Provider Initiated Contact with Patient 01/22/12 [id number]      Chief Complaint  Patient presents with  . Abdominal Pain  . Eye Pain   Abdominal Pain This is a recurrent problem. The current episode started 1 to 4 weeks ago. The onset quality is sudden. The problem occurs 2 to 4 times per day. The problem has been unchanged. The pain is located in the LLQ and RLQ. The pain is at a severity of 6/10. The quality of the pain is aching and sharp. The abdominal pain does not radiate. Pertinent negatives include no constipation, diarrhea, dysuria, frequency, hematuria, nausea or vomiting.  Eye Pain  Associated symptoms include blurred vision, an eye discharge and eye redness. Pertinent negatives include no nausea or vomiting.    OB History    Grav Para Term Preterm Abortions TAB SAB Ect Mult Living   4 2 2  1 1    2       Past Medical History  Diagnosis Date  . Trichomonas   . Anemia     with last pregnancy    Past Surgical History  Procedure Date  . Induced abortion     Family History  Problem Relation Age of Onset  . Other Neg Hx     History  Substance Use Topics  . Smoking status: Current Every Day Smoker -- 0.2 packs/day    Types: Cigarettes  . Smokeless tobacco: Former Neurosurgeon    Quit date:  11/05/2011  . Alcohol Use: No    Allergies: No Known Allergies  Prescriptions prior to admission  Medication Sig Dispense Refill  . cephALEXin (KEFLEX) 500 MG capsule Take 500 mg by mouth 2 (two) times daily. Pt got #30 capsules filled on 11/12/11 for dental issues.  Pt still taking occasionally      . IRON PO Take 1 tablet by mouth daily.      . Prenatal Vit-Fe Fumarate-FA (PRENATAL MULTIVITAMIN) TABS Take 1 tablet by mouth daily.        Review of Systems  Constitutional: Negative.   HENT: Negative.  Negative for congestion and sore throat.        Had some nasal congestion ~ 4-5 days ago, but has resolved  Eyes: Positive for blurred vision, discharge and redness. Negative for pain.       Bilateral eye redness, itching, and white drainage x 2-3 days. Has not been in contact w/ anyone w/ cojunctivitis  Respiratory: Negative.   Cardiovascular: Negative.   Gastrointestinal: Positive for abdominal pain. Negative for nausea, vomiting, diarrhea and constipation.       Sharp achy pain in RLQ and LLQ when moving/changing positions, especially when turning from side to side or sitting up from a lying position.  Has been going on for weeks,  takes apap which helps. Does not feel like cramping or uc's.    Genitourinary: Negative.  Negative for dysuria, urgency, frequency, hematuria and flank pain.  Musculoskeletal: Negative.   Skin: Negative.   Neurological: Negative.   Endo/Heme/Allergies: Negative.   Psychiatric/Behavioral: Negative.    Physical Exam   Blood pressure 110/65, pulse 97, temperature 97.9 F (36.6 C), temperature source Oral, resp. rate 18, last menstrual period 06/06/2011.  Physical Exam  Constitutional: She is oriented to person, place, and time. She appears well-developed and well-nourished.  HENT:  Head: Normocephalic.  Mouth/Throat: Oropharynx is clear and moist.  Eyes: Right eye exhibits no discharge. Left eye exhibits no discharge.       Bilateral scleral erythema,  no active drainage. Suborbital edema.   Neck: Normal range of motion.  Cardiovascular: Normal rate and regular rhythm.   Respiratory: Effort normal and breath sounds normal.  GI: Soft.       gravid  Genitourinary:       deferred  Musculoskeletal: Normal range of motion.  Neurological: She is alert and oriented to person, place, and time. She has normal reflexes.  Skin: Skin is warm and dry.  Psychiatric: She has a normal mood and affect. Her behavior is normal. Judgment and thought content normal.   FHR: 140, mod variability, 15x15 accels, no decels=Cat I UCs: occasional, mild MAU Course  Procedures  NST UA  Assessment and Plan  A:  [redacted]w[redacted]d [facility]UP  Round ligament pain  Bilateral viral conjunctivitis  Cat I FHR  P:  D/C home  Reviewed RLP relief measures  Anihistamine/decongestant eye drops, lubricating eye drops, and warm compresses prn  Discussed appropriate use of antibiotics (finish all of rx, don't save and take for other uses)  Keep appt as scheduled in clinic  Return to hospital if needed    Marge Duncans 01/22/2012, 10:01 AM

## 2012-01-25 ENCOUNTER — Encounter: Payer: Self-pay | Admitting: Family Medicine

## 2012-02-02 ENCOUNTER — Ambulatory Visit (INDEPENDENT_AMBULATORY_CARE_PROVIDER_SITE_OTHER): Payer: Medicaid Other | Admitting: Advanced Practice Midwife

## 2012-02-02 VITALS — BP 108/66 | Temp 98.0°F | Wt 155.2 lb

## 2012-02-02 DIAGNOSIS — Z3480 Encounter for supervision of other normal pregnancy, unspecified trimester: Secondary | ICD-10-CM

## 2012-02-02 DIAGNOSIS — O0930 Supervision of pregnancy with insufficient antenatal care, unspecified trimester: Secondary | ICD-10-CM

## 2012-02-02 LAB — POCT URINALYSIS DIP (DEVICE)
Bilirubin Urine: NEGATIVE
Hgb urine dipstick: NEGATIVE
Ketones, ur: NEGATIVE mg/dL
Protein, ur: NEGATIVE mg/dL
pH: 7 (ref 5.0–8.0)

## 2012-02-02 NOTE — Progress Notes (Signed)
No c/o other than general discomfort, says she's ready to have the baby. Rev'd warning signs. GBS/GC at next visit.

## 2012-02-02 NOTE — Patient Instructions (Signed)
Pregnancy - Third Trimester The third trimester of pregnancy (the last 3 months) is a period of the most rapid growth for you and your baby. The baby approaches a length of 20 inches and a weight of 6 to 10 pounds. The baby is adding on fat and getting ready for life outside your body. While inside, babies have periods of sleeping and waking, suck their thumbs, and hiccups. You can often feel small contractions of the uterus. This is false labor. It is also called Braxton-Hicks contractions. This is like a practice for labor. The usual problems in this stage of pregnancy include more difficulty breathing, swelling of the hands and feet from water retention, and having to urinate more often because of the uterus and baby pressing on your bladder.  PRENATAL EXAMS  Blood work may continue to be done during prenatal exams. These tests are done to check on your health and the probable health of your baby. Blood work is used to follow your blood levels (hemoglobin). Anemia (low hemoglobin) is common during pregnancy. Iron and vitamins are given to help prevent this. You may also continue to be checked for diabetes. Some of the past blood tests may be done again.  The size of the uterus is measured during each visit. This makes sure your baby is growing properly according to your pregnancy dates.  Your blood pressure is checked every prenatal visit. This is to make sure you are not getting toxemia.  Your urine is checked every prenatal visit for infection, diabetes and protein.  Your weight is checked at each visit. This is done to make sure gains are happening at the suggested rate and that you and your baby are growing normally.  Sometimes, an ultrasound is performed to confirm the position and the proper growth and development of the baby. This is a test done that bounces harmless sound waves off the baby so your caregiver can more accurately determine due dates.  Discuss the type of pain medication  and anesthesia you will have during your labor and delivery.  Discuss the possibility and anesthesia if a Cesarean Section might be necessary.  Inform your caregiver if there is any mental or physical violence at home. Sometimes, a specialized non-stress test, contraction stress test and biophysical profile are done to make sure the baby is not having a problem. Checking the amniotic fluid surrounding the baby is called an amniocentesis. The amniotic fluid is removed by sticking a needle into the belly (abdomen). This is sometimes done near the end of pregnancy if an early delivery is required. In this case, it is done to help make sure the baby's lungs are mature enough for the baby to live outside of the womb. If the lungs are not mature and it is unsafe to deliver the baby, an injection of cortisone medication is given to the mother 1 to 2 days before the delivery. This helps the baby's lungs mature and makes it safer to deliver the baby. CHANGES OCCURING IN THE THIRD TRIMESTER OF PREGNANCY Your body goes through many changes during pregnancy. They vary from person to person. Talk to your caregiver about changes you notice and are concerned about.  During the last trimester, you have probably had an increase in your appetite. It is normal to have cravings for certain foods. This varies from person to person and pregnancy to pregnancy.  You may begin to get stretch marks on your hips, abdomen, and breasts. These are normal changes in the body   during pregnancy. There are no exercises or medications to take which prevent this change.  Constipation may be treated with a stool softener or adding bulk to your diet. Drinking lots of fluids, fiber in vegetables, fruits, and whole grains are helpful.  Exercising is also helpful. If you have been very active up until your pregnancy, most of these activities can be continued during your pregnancy. If you have been less active, it is helpful to start an  exercise program such as walking. Consult your caregiver before starting exercise programs.  Avoid all smoking, alcohol, un-prescribed drugs, herbs and "street drugs" during your pregnancy. These chemicals affect the formation and growth of the baby. Avoid chemicals throughout the pregnancy to ensure the delivery of a healthy infant.  Backache, varicose veins and hemorrhoids may develop or get worse.  You will tire more easily in the third trimester, which is normal.  The baby's movements may be stronger and more often.  You may become short of breath easily.  Your belly button may stick out.  A yellow discharge may leak from your breasts called colostrum.  You may have a bloody mucus discharge. This usually occurs a few days to a week before labor begins. HOME CARE INSTRUCTIONS   Keep your caregiver's appointments. Follow your caregiver's instructions regarding medication use, exercise, and diet.  During pregnancy, you are providing food for you and your baby. Continue to eat regular, well-balanced meals. Choose foods such as meat, fish, milk and other low fat dairy products, vegetables, fruits, and whole-grain breads and cereals. Your caregiver will tell you of the ideal weight gain.  A physical sexual relationship may be continued throughout pregnancy if there are no other problems such as early (premature) leaking of amniotic fluid from the membranes, vaginal bleeding, or belly (abdominal) pain.  Exercise regularly if there are no restrictions. Check with your caregiver if you are unsure of the safety of your exercises. Greater weight gain will occur in the last 2 trimesters of pregnancy. Exercising helps:  Control your weight.  Get you in shape for labor and delivery.  You lose weight after you deliver.  Rest a lot with legs elevated, or as needed for leg cramps or low back pain.  Wear a good support or jogging bra for breast tenderness during pregnancy. This may help if worn  during sleep. Pads or tissues may be used in the bra if you are leaking colostrum.  Do not use hot tubs, steam rooms, or saunas.  Wear your seat belt when driving. This protects you and your baby if you are in an accident.  Avoid raw meat, cat litter boxes and soil used by cats. These carry germs that can cause birth defects in the baby.  It is easier to loose urine during pregnancy. Tightening up and strengthening the pelvic muscles will help with this problem. You can practice stopping your urination while you are going to the bathroom. These are the same muscles you need to strengthen. It is also the muscles you would use if you were trying to stop from passing gas. You can practice tightening these muscles up 10 times a set and repeating this about 3 times per day. Once you know what muscles to tighten up, do not perform these exercises during urination. It is more likely to cause an infection by backing up the urine.  Ask for help if you have financial, counseling or nutritional needs during pregnancy. Your caregiver will be able to offer counseling for these   needs as well as refer you for other special needs.  Make a list of emergency phone numbers and have them available.  Plan on getting help from family or friends when you go home from the hospital.  Make a trial run to the hospital.  Take prenatal classes with the father to understand, practice and ask questions about the labor and delivery.  Prepare the baby's room/nursery.  Do not travel out of the city unless it is absolutely necessary and with the advice of your caregiver.  Wear only low or no heal shoes to have better balance and prevent falling. MEDICATIONS AND DRUG USE IN PREGNANCY  Take prenatal vitamins as directed. The vitamin should contain 1 milligram of folic acid. Keep all vitamins out of reach of children. Only a couple vitamins or tablets containing iron may be fatal to a baby or young child when  ingested.  Avoid use of all medications, including herbs, over-the-counter medications, not prescribed or suggested by your caregiver. Only take over-the-counter or prescription medicines for pain, discomfort, or fever as directed by your caregiver. Do not use aspirin, ibuprofen (Motrin, Advil, Nuprin) or naproxen (Aleve) unless OK'd by your caregiver.  Let your caregiver also know about herbs you may be using.  Alcohol is related to a number of birth defects. This includes fetal alcohol syndrome. All alcohol, in any form, should be avoided completely. Smoking will cause low birth rate and premature babies.  Street/illegal drugs are very harmful to the baby. They are absolutely forbidden. A baby born to an addicted mother will be addicted at birth. The baby will go through the same withdrawal an adult does. SEEK MEDICAL CARE IF: You have any concerns or worries during your pregnancy. It is better to call with your questions if you feel they cannot wait, rather than worry about them. DECISIONS ABOUT CIRCUMCISION You may or may not know the sex of your baby. If you know your baby is a boy, it may be time to think about circumcision. Circumcision is the removal of the foreskin of the penis. This is the skin that covers the sensitive end of the penis. There is no proven medical need for this. Often this decision is made on what is popular at the time or based upon religious beliefs and social issues. You can discuss these issues with your caregiver or pediatrician. SEEK IMMEDIATE MEDICAL CARE IF:   An unexplained oral temperature above 102 F (38.9 C) develops, or as your caregiver suggests.  You have leaking of fluid from the vagina (birth canal). If leaking membranes are suspected, take your temperature and tell your caregiver of this when you call.  There is vaginal spotting, bleeding or passing clots. Tell your caregiver of the amount and how many pads are used.  You develop a bad smelling  vaginal discharge with a change in the color from clear to white.  You develop vomiting that lasts more than 24 hours.  You develop chills or fever.  You develop shortness of breath.  You develop burning on urination.  You loose more than 2 pounds of weight or gain more than 2 pounds of weight or as suggested by your caregiver.  You notice sudden swelling of your face, hands, and feet or legs.  You develop belly (abdominal) pain. Round ligament discomfort is a common non-cancerous (benign) cause of abdominal pain in pregnancy. Your caregiver still must evaluate you.  You develop a severe headache that does not go away.  You develop visual   problems, blurred or double vision.  If you have not felt your baby move for more than 1 hour. If you think the baby is not moving as much as usual, eat something with sugar in it and lie down on your left side for an hour. The baby should move at least 4 to 5 times per hour. Call right away if your baby moves less than that.  You fall, are in a car accident or any kind of trauma.  There is mental or physical violence at home. Document Released: 03/02/2001 Document Revised: 05/31/2011 Document Reviewed: 09/04/2008 [employer] Patient Information 2013 [employer].  Breastfeeding Deciding to breastfeed is one of the best choices you can make for you and your baby. The information that follows gives a brief overview of the benefits of breastfeeding as well as common topics surrounding breastfeeding. BENEFITS OF BREASTFEEDING For the baby  The first milk (colostrum) helps the baby's digestive system function better.   There are antibodies in the mother's milk that help the baby fight off infections.   The baby has a lower incidence of asthma, allergies, and sudden infant death syndrome (SIDS).   The nutrients in breast milk are better for the baby than infant formulas, and breast milk helps the baby's brain grow better.   Babies who  breastfeed have less gas, colic, and constipation.  For the mother  Breastfeeding helps develop a very special bond between the mother and her baby.   Breastfeeding is convenient, always available at the correct temperature, and costs nothing.   Breastfeeding burns calories in the mother and helps her lose weight that was gained during pregnancy.   Breastfeeding makes the uterus contract back down to normal size faster and slows bleeding following delivery.   Breastfeeding mothers have a lower risk of developing breast cancer.  BREASTFEEDING FREQUENCY  A healthy, full-term baby may breastfeed as often as every hour or space his or her feedings to every 3 hours.   Watch your baby for signs of hunger. Nurse your baby if he or she shows signs of hunger. How often you nurse will vary from baby to baby.   Nurse as often as the baby requests, or when you feel the need to reduce the fullness of your breasts.   Awaken the baby if it has been 3 4 hours since the last feeding.   Frequent feeding will help the mother make more milk and will help prevent problems, such as sore nipples and engorgement of the breasts.  BABY'S POSITION AT THE BREAST  Whether lying down or sitting, be sure that the baby's tummy is facing your tummy.   Support the breast with 4 fingers underneath the breast and the thumb above. Make sure your fingers are well away from the nipple and baby's mouth.   Stroke the baby's lips gently with your finger or nipple.   When the baby's mouth is open wide enough, place all of your nipple and as much of the areola as possible into your baby's mouth.   Pull the baby in close so the tip of the nose and the baby's cheeks touch the breast during the feeding.  FEEDINGS AND SUCTION  The length of each feeding varies from baby to baby and from feeding to feeding.   The baby must suck about 2 3 minutes for your milk to get to him or her. This is called a "let down."  For this reason, allow the baby to feed on each breast as   long as he or she wants. Your baby will end the feeding when he or she has received the right balance of nutrients.   To break the suction, put your finger into the corner of the baby's mouth and slide it between his or her gums before removing your breast from his or her mouth. This will help prevent sore nipples.  HOW TO TELL WHETHER YOUR BABY IS GETTING ENOUGH BREAST MILK. Wondering whether or not your baby is getting enough milk is a common concern among mothers. You can be assured that your baby is getting enough milk if:   Your baby is actively sucking and you hear swallowing.   Your baby seems relaxed and satisfied after a feeding.   Your baby nurses at least 8 12 times in a 24 hour time period. Nurse your baby until he or she unlatches or falls asleep at the first breast (at least 10 20 minutes), then offer the second side.   Your baby is wetting 5 6 disposable diapers (6 8 cloth diapers) in a 24 hour period by 5 6 days of age.   Your baby is having at least 3 4 stools every 24 hours for the first 6 weeks. The stool should be soft and yellow.   Your baby should gain 4 7 ounces per week after he or she is 4 days old.   Your breasts feel softer after nursing.  REDUCING BREAST ENGORGEMENT  In the first week after your baby is born, you may experience signs of breast engorgement. When breasts are engorged, they feel heavy, warm, full, and may be tender to the touch. You can reduce engorgement if you:   Nurse frequently, every 2 3 hours. Mothers who breastfeed early and often have fewer problems with engorgement.   Place light ice packs on your breasts for 10 20 minutes between feedings. This reduces swelling. Wrap the ice packs in a lightweight towel to protect your skin. Bags of frozen vegetables work well for this purpose.   Take a warm shower or apply warm, moist heat to your breast for 5 10 minutes just before  each feeding. This increases circulation and helps the milk flow.   Gently massage your breast before and during the feeding. Using your finger tips, massage from the chest wall towards your nipple in a circular motion.   Make sure that the baby empties at least one breast at every feeding before switching sides.   Use a breast pump to empty the breasts if your baby is sleepy or not nursing well. You may also want to pump if you are returning to work oryou feel you are getting engorged.   Avoid bottle feeds, pacifiers, or supplemental feedings of water or juice in place of breastfeeding. Breast milk is all the food your baby needs. It is not necessary for your baby to have water or formula. In fact, to help your breasts make more milk, it is best not to give your baby supplemental feedings during the early weeks.   Be sure the baby is latched on and positioned properly while breastfeeding.   Wear a supportive bra, avoiding underwire styles.   Eat a balanced diet with enough fluids.   Rest often, relax, and take your prenatal vitamins to prevent fatigue, stress, and anemia.  If you follow these suggestions, your engorgement should improve in 24 48 hours. If you are still experiencing difficulty, call your lactation consultant or caregiver.  CARING FOR YOURSELF Take care of your   breasts  Bathe or shower daily.   Avoid using soap on your nipples.   Start feedings on your left breast at one feeding and on your right breast at the next feeding.   You will notice an increase in your milk supply 2 5 days after delivery. You may feel some discomfort from engorgement, which makes your breasts very firm and often tender. Engorgement "peaks" out within 24 48 hours. In the meantime, apply warm moist towels to your breasts for 5 10 minutes before feeding. Gentle massage and expression of some milk before feeding will soften your breasts, making it easier for your baby to latch on.    Wear a well-fitting nursing bra, and air dry your nipples for a 3 4minutes after each feeding.   Only use cotton bra pads.   Only use pure lanolin on your nipples after nursing. You do not need to wash it off before feeding the baby again. Another option is to express a few drops of breast milk and gently massage it into your nipples.  Take care of yourself  Eat well-balanced meals and nutritious snacks.   Drinking milk, fruit juice, and water to satisfy your thirst (about 8 glasses a day).   Get plenty of rest.  Avoid foods that you notice affect the baby in a bad way.  SEEK MEDICAL CARE IF:   You have difficulty with breastfeeding and need help.   You have a hard, red, sore area on your breast that is accompanied by a fever.   Your baby is too sleepy to eat well or is having trouble sleeping.   Your baby is wetting less than 6 diapers a day, by 5 days of age.   Your baby's skin or white part of his or her eyes is more yellow than it was in the hospital.   You feel depressed.  Document Released: 03/08/2005 Document Revised: 09/07/2011 Document Reviewed: 06/06/2011 [employer] Patient Information 2013 [employer].  

## 2012-02-02 NOTE — Progress Notes (Signed)
P = 98 

## 2012-02-16 ENCOUNTER — Ambulatory Visit (INDEPENDENT_AMBULATORY_CARE_PROVIDER_SITE_OTHER): Payer: Medicaid Other | Admitting: Advanced Practice Midwife

## 2012-02-16 ENCOUNTER — Other Ambulatory Visit (HOSPITAL_COMMUNITY)
Admission: RE | Admit: 2012-02-16 | Discharge: 2012-02-16 | Disposition: A | Discharge status: Home / Self Care | Payer: Medicaid Other | Source: Ambulatory Visit | Attending: Obstetrics & Gynecology | Admitting: Obstetrics & Gynecology

## 2012-02-16 VITALS — BP 112/64 | Temp 97.5°F | Wt 159.8 lb

## 2012-02-16 DIAGNOSIS — Z113 Encounter for screening for infections with a predominantly sexual mode of transmission: Secondary | ICD-10-CM | POA: Insufficient documentation

## 2012-02-16 DIAGNOSIS — O0930 Supervision of pregnancy with insufficient antenatal care, unspecified trimester: Secondary | ICD-10-CM

## 2012-02-16 LAB — POCT URINALYSIS DIP (DEVICE)
Bilirubin Urine: NEGATIVE
Glucose, UA: NEGATIVE mg/dL
Hgb urine dipstick: NEGATIVE
Ketones, ur: NEGATIVE mg/dL
Specific Gravity, Urine: 1.015 (ref 1.005–1.030)
pH: 7 (ref 5.0–8.0)

## 2012-02-16 LAB — OB RESULTS CONSOLE GBS: GBS: POSITIVE

## 2012-02-16 NOTE — Progress Notes (Signed)
Well, ready for baby. GBS and GC done today. Rev'd precautions.

## 2012-02-16 NOTE — Progress Notes (Signed)
P = 98 Pressure in vaginal area

## 2012-02-16 NOTE — Patient Instructions (Signed)
Pregnancy - Third Trimester  The third trimester of pregnancy (the last 3 months) is a period of the most rapid growth for you and your baby. The baby approaches a length of 20 inches and a weight of 6 to 10 pounds. The baby is adding on fat and getting ready for life outside your body. While inside, babies have periods of sleeping and waking, suck their thumbs, and hiccups. You can often feel small contractions of the uterus. This is false labor. It is also called Braxton-Hicks contractions. This is like a practice for labor. The usual problems in this stage of pregnancy include more difficulty breathing, swelling of the hands and feet from water retention, and having to urinate more often because of the uterus and baby pressing on your bladder.   PRENATAL EXAMS  · Blood work may continue to be done during prenatal exams. These tests are done to check on your health and the probable health of your baby. Blood work is used to follow your blood levels (hemoglobin). Anemia (low hemoglobin) is common during pregnancy. Iron and vitamins are given to help prevent this. You may also continue to be checked for diabetes. Some of the past blood tests may be done again.  · The size of the uterus is measured during each visit. This makes sure your baby is growing properly according to your pregnancy dates.  · Your blood pressure is checked every prenatal visit. This is to make sure you are not getting toxemia.  · Your urine is checked every prenatal visit for infection, diabetes and protein.  · Your weight is checked at each visit. This is done to make sure gains are happening at the suggested rate and that you and your baby are growing normally.  · Sometimes, an ultrasound is performed to confirm the position and the proper growth and development of the baby. This is a test done that bounces harmless sound waves off the baby so your caregiver can more accurately determine due dates.  · Discuss the type of pain medication and  anesthesia you will have during your labor and delivery.  · Discuss the possibility and anesthesia if a Cesarean Section might be necessary.  · Inform your caregiver if there is any mental or physical violence at home.  Sometimes, a specialized non-stress test, contraction stress test and biophysical profile are done to make sure the baby is not having a problem. Checking the amniotic fluid surrounding the baby is called an amniocentesis. The amniotic fluid is removed by sticking a needle into the belly (abdomen). This is sometimes done near the end of pregnancy if an early delivery is required. In this case, it is done to help make sure the baby's lungs are mature enough for the baby to live outside of the womb. If the lungs are not mature and it is unsafe to deliver the baby, an injection of cortisone medication is given to the mother 1 to 2 days before the delivery. This helps the baby's lungs mature and makes it safer to deliver the baby.  CHANGES OCCURING IN THE THIRD TRIMESTER OF PREGNANCY  Your body goes through many changes during pregnancy. They vary from person to person. Talk to your caregiver about changes you notice and are concerned about.  · During the last trimester, you have probably had an increase in your appetite. It is normal to have cravings for certain foods. This varies from person to person and pregnancy to pregnancy.  · You may begin to   get stretch marks on your hips, abdomen, and breasts. These are normal changes in the body during pregnancy. There are no exercises or medications to take which prevent this change.  · Constipation may be treated with a stool softener or adding bulk to your diet. Drinking lots of fluids, fiber in vegetables, fruits, and whole grains are helpful.  · Exercising is also helpful. If you have been very active up until your pregnancy, most of these activities can be continued during your pregnancy. If you have been less active, it is helpful to start an exercise  program such as walking. Consult your caregiver before starting exercise programs.  · Avoid all smoking, alcohol, un-prescribed drugs, herbs and "street drugs" during your pregnancy. These chemicals affect the formation and growth of the baby. Avoid chemicals throughout the pregnancy to ensure the delivery of a healthy infant.  · Backache, varicose veins and hemorrhoids may develop or get worse.  · You will tire more easily in the third trimester, which is normal.  · The baby's movements may be stronger and more often.  · You may become short of breath easily.  · Your belly button may stick out.  · A yellow discharge may leak from your breasts called colostrum.  · You may have a bloody mucus discharge. This usually occurs a few days to a week before labor begins.  HOME CARE INSTRUCTIONS   · Keep your caregiver's appointments. Follow your caregiver's instructions regarding medication use, exercise, and diet.  · During pregnancy, you are providing food for you and your baby. Continue to eat regular, well-balanced meals. Choose foods such as meat, fish, milk and other low fat dairy products, vegetables, fruits, and whole-grain breads and cereals. Your caregiver will tell you of the ideal weight gain.  · A physical sexual relationship may be continued throughout pregnancy if there are no other problems such as early (premature) leaking of amniotic fluid from the membranes, vaginal bleeding, or belly (abdominal) pain.  · Exercise regularly if there are no restrictions. Check with your caregiver if you are unsure of the safety of your exercises. Greater weight gain will occur in the last 2 trimesters of pregnancy. Exercising helps:  · Control your weight.  · Get you in shape for labor and delivery.  · You lose weight after you deliver.  · Rest a lot with legs elevated, or as needed for leg cramps or low back pain.  · Wear a good support or jogging bra for breast tenderness during pregnancy. This may help if worn during  sleep. Pads or tissues may be used in the bra if you are leaking colostrum.  · Do not use hot tubs, steam rooms, or saunas.  · Wear your seat belt when driving. This protects you and your baby if you are in an accident.  · Avoid raw meat, cat litter boxes and soil used by cats. These carry germs that can cause birth defects in the baby.  · It is easier to loose urine during pregnancy. Tightening up and strengthening the pelvic muscles will help with this problem. You can practice stopping your urination while you are going to the bathroom. These are the same muscles you need to strengthen. It is also the muscles you would use if you were trying to stop from passing gas. You can practice tightening these muscles up 10 times a set and repeating this about 3 times per day. Once you know what muscles to tighten up, do not perform these   exercises during urination. It is more likely to cause an infection by backing up the urine.  · Ask for help if you have financial, counseling or nutritional needs during pregnancy. Your caregiver will be able to offer counseling for these needs as well as refer you for other special needs.  · Make a list of emergency phone numbers and have them available.  · Plan on getting help from family or friends when you go home from the hospital.  · Make a trial run to the hospital.  · Take prenatal classes with the father to understand, practice and ask questions about the labor and delivery.  · Prepare the baby's room/nursery.  · Do not travel out of the city unless it is absolutely necessary and with the advice of your caregiver.  · Wear only low or no heal shoes to have better balance and prevent falling.  MEDICATIONS AND DRUG USE IN PREGNANCY  · Take prenatal vitamins as directed. The vitamin should contain 1 milligram of folic acid. Keep all vitamins out of reach of children. Only a couple vitamins or tablets containing iron may be fatal to a baby or young child when ingested.  · Avoid use  of all medications, including herbs, over-the-counter medications, not prescribed or suggested by your caregiver. Only take over-the-counter or prescription medicines for pain, discomfort, or fever as directed by your caregiver. Do not use aspirin, ibuprofen (Motrin®, Advil®, Nuprin®) or naproxen (Aleve®) unless OK'd by your caregiver.  · Let your caregiver also know about herbs you may be using.  · Alcohol is related to a number of birth defects. This includes fetal alcohol syndrome. All alcohol, in any form, should be avoided completely. Smoking will cause low birth rate and premature babies.  · Street/illegal drugs are very harmful to the baby. They are absolutely forbidden. A baby born to an addicted mother will be addicted at birth. The baby will go through the same withdrawal an adult does.  SEEK MEDICAL CARE IF:  You have any concerns or worries during your pregnancy. It is better to call with your questions if you feel they cannot wait, rather than worry about them.  DECISIONS ABOUT CIRCUMCISION  You may or may not know the sex of your baby. If you know your baby is a boy, it may be time to think about circumcision. Circumcision is the removal of the foreskin of the penis. This is the skin that covers the sensitive end of the penis. There is no proven medical need for this. Often this decision is made on what is popular at the time or based upon religious beliefs and social issues. You can discuss these issues with your caregiver or pediatrician.  SEEK IMMEDIATE MEDICAL CARE IF:   · An unexplained oral temperature above 102° F (38.9° C) develops, or as your caregiver suggests.  · You have leaking of fluid from the vagina (birth canal). If leaking membranes are suspected, take your temperature and tell your caregiver of this when you call.  · There is vaginal spotting, bleeding or passing clots. Tell your caregiver of the amount and how many pads are used.  · You develop a bad smelling vaginal discharge with  a change in the color from clear to white.  · You develop vomiting that lasts more than 24 hours.  · You develop chills or fever.  · You develop shortness of breath.  · You develop burning on urination.  · You loose more than 2 pounds of weight   or gain more than 2 pounds of weight or as suggested by your caregiver.  · You notice sudden swelling of your face, hands, and feet or legs.  · You develop belly (abdominal) pain. Round ligament discomfort is a common non-cancerous (benign) cause of abdominal pain in pregnancy. Your caregiver still must evaluate you.  · You develop a severe headache that does not go away.  · You develop visual problems, blurred or double vision.  · If you have not felt your baby move for more than 1 hour. If you think the baby is not moving as much as usual, eat something with sugar in it and lie down on your left side for an hour. The baby should move at least 4 to 5 times per hour. Call right away if your baby moves less than that.  · You fall, are in a car accident or any kind of trauma.  · There is mental or physical violence at home.  Document Released: 03/02/2001 Document Revised: 05/31/2011 Document Reviewed: 09/04/2008  ExitCare® Patient Information ©2013 [employer].

## 2012-02-23 ENCOUNTER — Ambulatory Visit (INDEPENDENT_AMBULATORY_CARE_PROVIDER_SITE_OTHER): Payer: Medicaid Other | Admitting: Obstetrics & Gynecology

## 2012-02-23 VITALS — BP 120/77 | Temp 97.0°F | Wt 161.6 lb

## 2012-02-23 DIAGNOSIS — O0930 Supervision of pregnancy with insufficient antenatal care, unspecified trimester: Secondary | ICD-10-CM

## 2012-02-23 DIAGNOSIS — O99330 Smoking (tobacco) complicating pregnancy, unspecified trimester: Secondary | ICD-10-CM

## 2012-02-23 LAB — POCT URINALYSIS DIP (DEVICE)
Bilirubin Urine: NEGATIVE
Hgb urine dipstick: NEGATIVE
Leukocytes, UA: NEGATIVE
Protein, ur: NEGATIVE mg/dL
Specific Gravity, Urine: 1.02 (ref 1.005–1.030)
pH: 7 (ref 5.0–8.0)

## 2012-02-23 NOTE — Progress Notes (Signed)
P = 92 Vaginal pressure and occasional pain

## 2012-02-23 NOTE — Patient Instructions (Signed)
Normal Labor and Delivery  Your caregiver must first be sure you are in labor. Signs of labor include:  · You may pass what is called "the mucus plug" before labor begins. This is a small amount of blood stained mucus.  · Regular uterine contractions.  · The time between contractions get closer together.  · The discomfort and pain gradually gets more intense.  · Pains are mostly located in the back.  · Pains get worse when walking.  · The cervix (the opening of the uterus becomes thinner (begins to efface) and opens up (dilates).  Once you are in labor and admitted into the hospital or care center, your caregiver will do the following:  · A complete physical examination.  · Check your vital signs (blood pressure, pulse, temperature and the fetal heart rate).  · Do a vaginal examination (using a sterile glove and lubricant) to determine:  · The position (presentation) of the baby (head [vertex] or buttock first).  · The level (station) of the baby's head in the birth canal.  · The effacement and dilatation of the cervix.  · You may have your pubic hair shaved and be given an enema depending on your caregiver and the circumstance.  · An electronic monitor is usually placed on your abdomen. The monitor follows the length and intensity of the contractions, as well as the baby's heart rate.  · Usually, your caregiver will insert an IV in your arm with a bottle of sugar water. This is done as a precaution so that medications can be given to you quickly during labor or delivery.  NORMAL LABOR AND DELIVERY IS DIVIDED UP INTO 3 STAGES:  First Stage  This is when regular contractions begin and the cervix begins to efface and dilate. This stage can last from 3 to 15 hours. The end of the first stage is when the cervix is 100% effaced and 10 centimeters dilated. Pain medications may be given by   · Injection (morphine, demerol, etc.)  · Regional anesthesia (spinal, caudal or epidural, anesthetics given in different locations of  the spine). Paracervical pain medication may be given, which is an injection of and anesthetic on each side of the cervix.  A pregnant woman may request to have "Natural Childbirth" which is not to have any medications or anesthesia during her labor and delivery.  Second Stage  This is when the baby comes down through the birth canal (vagina) and is born. This can take 1 to 4 hours. As the baby's head comes down through the birth canal, you may feel like you are going to have a bowel movement. You will get the urge to bear down and push until the baby is delivered. As the baby's head is being delivered, the caregiver will decide if an episiotomy (a cut in the perineum and vagina area) is needed to prevent tearing of the tissue in this area. The episiotomy is sewn up after the delivery of the baby and placenta. Sometimes a mask with nitrous oxide is given for the mother to breath during the delivery of the baby to help if there is too much pain. The end of Stage 2 is when the baby is fully delivered. Then when the umbilical cord stops pulsating it is clamped and cut.  Third Stage  The third stage begins after the baby is completely delivered and ends after the placenta (afterbirth) is delivered. This usually takes 5 to 30 minutes. After the placenta is delivered, a medication   is given either by intravenous or injection to help contract the uterus and prevent bleeding. The third stage is not painful and pain medication is usually not necessary. If an episiotomy was done, it is repaired at this time.  After the delivery, the mother is watched and monitored closely for 1 to 2 hours to make sure there is no postpartum bleeding (hemorrhage). If there is a lot of bleeding, medication is given to contract the uterus and stop the bleeding.  Document Released: 12/16/2007 Document Revised: 05/31/2011 Document Reviewed: 12/16/2007  ExitCare® Patient Information ©2013 [employer].

## 2012-02-23 NOTE — Progress Notes (Signed)
Pelvic pressure and rare UC.

## 2012-02-28 ENCOUNTER — Inpatient Hospital Stay (HOSPITAL_COMMUNITY)
Admission: AD | Admit: 2012-02-28 | Discharge: 2012-03-03 | DRG: 767 | Disposition: A | Discharge status: Home / Self Care | Payer: Medicaid Other | Source: Ambulatory Visit | Attending: Obstetrics & Gynecology | Admitting: Obstetrics & Gynecology

## 2012-02-28 ENCOUNTER — Emergency Department (HOSPITAL_COMMUNITY): Payer: Medicaid Other

## 2012-02-28 ENCOUNTER — Inpatient Hospital Stay (HOSPITAL_COMMUNITY): Payer: Medicaid Other

## 2012-02-28 ENCOUNTER — Encounter (HOSPITAL_COMMUNITY): Payer: Self-pay | Admitting: *Deleted

## 2012-02-28 DIAGNOSIS — O99892 Other specified diseases and conditions complicating childbirth: Secondary | ICD-10-CM | POA: Diagnosis present

## 2012-02-28 DIAGNOSIS — Z302 Encounter for sterilization: Secondary | ICD-10-CM

## 2012-02-28 DIAGNOSIS — Z22330 Carrier of Group B streptococcus: Secondary | ICD-10-CM

## 2012-02-28 DIAGNOSIS — Y92410 Unspecified street and highway as the place of occurrence of the external cause: Secondary | ICD-10-CM

## 2012-02-28 DIAGNOSIS — Z3480 Encounter for supervision of other normal pregnancy, unspecified trimester: Secondary | ICD-10-CM

## 2012-02-28 DIAGNOSIS — O99330 Smoking (tobacco) complicating pregnancy, unspecified trimester: Secondary | ICD-10-CM

## 2012-02-28 MED ORDER — DOCUSATE SODIUM 100 MG PO CAPS
100.0000 mg | ORAL_CAPSULE | Freq: Every day | ORAL | Status: DC
  Administered 2012-02-29: 100 mg via ORAL
  Filled 2012-02-28: qty 1

## 2012-02-28 MED ORDER — PRENATAL MULTIVITAMIN CH
1.0000 | ORAL_TABLET | Freq: Every day | ORAL | Status: DC
Start: 2012-02-29 — End: 2012-02-29
  Administered 2012-02-29: 1 via ORAL
  Filled 2012-02-28: qty 1

## 2012-02-28 MED ORDER — SODIUM CHLORIDE 0.9 % IJ SOLN: 3.0000 mL | INTRAMUSCULAR | Status: DC | PRN

## 2012-02-28 MED ORDER — SODIUM CHLORIDE 0.9 % IJ SOLN
3.0000 mL | Freq: Two times a day (BID) | INTRAMUSCULAR | Status: DC
  Administered 2012-02-28 – 2012-02-29 (×2): 3 mL via INTRAVENOUS

## 2012-02-28 MED ORDER — CALCIUM CARBONATE ANTACID 500 MG PO CHEW: 2.0000 | CHEWABLE_TABLET | ORAL | Status: DC | PRN

## 2012-02-28 MED ORDER — ACETAMINOPHEN 500 MG PO TABS
1000.0000 mg | ORAL_TABLET | Freq: Once | ORAL | Status: AC
  Administered 2012-02-28: 1000 mg via ORAL
  Filled 2012-02-28: qty 2

## 2012-02-28 MED ORDER — ACETAMINOPHEN 325 MG PO TABS: 650.0000 mg | ORAL_TABLET | ORAL | Status: DC | PRN

## 2012-02-28 MED ORDER — SODIUM CHLORIDE 0.9 % IV SOLN: 250.0000 mL | INTRAVENOUS | Status: DC | PRN

## 2012-02-28 MED ORDER — ZOLPIDEM TARTRATE 5 MG PO TABS: 5.0000 mg | ORAL_TABLET | Freq: Every evening | ORAL | Status: DC | PRN
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
## 2012-02-28 NOTE — ED Notes (Signed)
Patient to be transported via [facility] to [facility] MAU for further monitoring. Pt c/o mild lower back pain and occasional mild lower abd pain. Per [facility] OB, patient is having a few contractions at times. Pt sts that she is 3 cm dilated per her OB at her last appt 5 days ago. Report given to Crystal, RN with [facility] who will be here in about 5 minutes to transport. Pt advised, updated and agrees with plan of care.

## 2012-02-28 NOTE — Progress Notes (Signed)
At pt bedside, pt denies contractions, vaginal discharge/bleeding, or leaking of fluid. Pt reports positive fetal movement. No external injuries noted. Abd soft and nontender.
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
## 2012-02-28 NOTE — ED Notes (Signed)
Patient transported to X-ray 
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
## 2012-02-28 NOTE — Progress Notes (Signed)
MAU Ginger Morris RN phoned and given report on pt and status and transfer to MAU after xrays have been assessed and pt cleared medically.
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
## 2012-02-29 ENCOUNTER — Encounter (HOSPITAL_COMMUNITY): Payer: Self-pay

## 2012-02-29 ENCOUNTER — Inpatient Hospital Stay (HOSPITAL_COMMUNITY): Payer: Medicaid Other

## 2012-02-29 DIAGNOSIS — O99019 Anemia complicating pregnancy, unspecified trimester: Secondary | ICD-10-CM

## 2012-02-29 DIAGNOSIS — A599 Trichomoniasis, unspecified: Secondary | ICD-10-CM

## 2012-02-29 DIAGNOSIS — O0930 Supervision of pregnancy with insufficient antenatal care, unspecified trimester: Secondary | ICD-10-CM

## 2012-02-29 LAB — TYPE AND SCREEN
ABO/RH(D): O POS
Antibody Screen: NEGATIVE

## 2012-02-29 LAB — CBC
HCT: 29.2 % — ABNORMAL LOW (ref 36.0–46.0)
Hemoglobin: 9.4 g/dL — ABNORMAL LOW (ref 12.0–15.0)
MCHC: 32.2 g/dL (ref 30.0–36.0)
RDW: 13.6 % (ref 11.5–15.5)
WBC: 8.4 10*3/uL (ref 4.0–10.5)

## 2012-02-29 MED ORDER — LIDOCAINE HCL (PF) 1 % IJ SOLN
30.0000 mL | INTRAMUSCULAR | Status: DC | PRN
  Filled 2012-02-29: qty 30

## 2012-02-29 MED ORDER — LACTATED RINGERS IV SOLN
500.0000 mL | INTRAVENOUS | Status: DC | PRN
  Administered 2012-03-01: 500 mL via INTRAVENOUS

## 2012-02-29 MED ORDER — ACETAMINOPHEN 325 MG PO TABS
650.0000 mg | ORAL_TABLET | ORAL | Status: DC | PRN
  Administered 2012-02-29: 650 mg via ORAL
  Filled 2012-02-29: qty 2

## 2012-02-29 MED ORDER — TERBUTALINE SULFATE 1 MG/ML IJ SOLN: 0.2500 mg | Freq: Once | INTRAMUSCULAR | Status: AC | PRN

## 2012-02-29 MED ORDER — PENICILLIN G POTASSIUM 5000000 UNITS IJ SOLR
2.5000 10*6.[IU] | INTRAMUSCULAR | Status: DC
  Administered 2012-03-01 (×3): 2.5 10*6.[IU] via INTRAVENOUS
  Filled 2012-02-29 (×8): qty 2.5

## 2012-02-29 MED ORDER — OXYTOCIN 40 UNITS IN LACTATED RINGERS INFUSION - SIMPLE MED
62.5000 mL/h | INTRAVENOUS | Status: DC
  Administered 2012-03-01: 62.5 mL/h via INTRAVENOUS

## 2012-02-29 MED ORDER — PENICILLIN G POTASSIUM 5000000 UNITS IJ SOLR
5.0000 10*6.[IU] | Freq: Once | INTRAVENOUS | Status: AC
  Administered 2012-02-29: 5 10*6.[IU] via INTRAVENOUS
  Filled 2012-02-29: qty 5

## 2012-02-29 MED ORDER — OXYTOCIN 40 UNITS IN LACTATED RINGERS INFUSION - SIMPLE MED
1.0000 m[IU]/min | INTRAVENOUS | Status: DC
  Administered 2012-02-29: 2 m[IU]/min via INTRAVENOUS
  Filled 2012-02-29: qty 1000

## 2012-02-29 MED ORDER — CITRIC ACID-SODIUM CITRATE 334-500 MG/5ML PO SOLN: 30.0000 mL | ORAL | Status: DC | PRN

## 2012-02-29 MED ORDER — OXYTOCIN BOLUS FROM INFUSION: 500.0000 mL | INTRAVENOUS | Status: DC

## 2012-02-29 MED ORDER — IBUPROFEN 600 MG PO TABS
600.0000 mg | ORAL_TABLET | Freq: Four times a day (QID) | ORAL | Status: DC | PRN
  Administered 2012-03-01: 600 mg via ORAL
  Filled 2012-02-29: qty 1

## 2012-02-29 MED ORDER — LACTATED RINGERS IV SOLN
INTRAVENOUS | Status: DC
  Administered 2012-02-29 – 2012-03-01 (×2): via INTRAVENOUS

## 2012-02-29 MED ORDER — OXYCODONE-ACETAMINOPHEN 5-325 MG PO TABS: 1.0000 | ORAL_TABLET | ORAL | Status: DC | PRN

## 2012-02-29 MED ORDER — ONDANSETRON HCL 4 MG/2ML IJ SOLN: 4.0000 mg | Freq: Four times a day (QID) | INTRAMUSCULAR | Status: DC | PRN

## 2012-02-29 NOTE — H&P (Signed)
I have seen and examined patient and agree with above. Lower quadrant tenderness L>R. I reviewed fetal heart tones, reassuring, but contractions present.  Baseline 120-130, moderate variability, accels present, no decels. TOCO:  Ctx q 3-10 minutes. Admit for observation for 24 hours. Napoleon Form, MD
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
## 2012-02-29 NOTE — Progress Notes (Signed)
UR completed 

## 2012-03-01 ENCOUNTER — Encounter (HOSPITAL_COMMUNITY): Payer: Self-pay | Admitting: *Deleted

## 2012-03-01 ENCOUNTER — Encounter: Payer: Medicaid Other | Admitting: Advanced Practice Midwife

## 2012-03-01 DIAGNOSIS — O9989 Other specified diseases and conditions complicating pregnancy, childbirth and the puerperium: Secondary | ICD-10-CM

## 2012-03-01 MED ORDER — ZOLPIDEM TARTRATE 5 MG PO TABS: 5.0000 mg | ORAL_TABLET | Freq: Every evening | ORAL | Status: DC | PRN

## 2012-03-01 MED ORDER — PRENATAL MULTIVITAMIN CH
1.0000 | ORAL_TABLET | Freq: Every day | ORAL | Status: DC
  Administered 2012-03-01 – 2012-03-03 (×3): 1 via ORAL
  Filled 2012-03-01 (×3): qty 1

## 2012-03-01 MED ORDER — DIPHENHYDRAMINE HCL 25 MG PO CAPS: 25.0000 mg | ORAL_CAPSULE | Freq: Four times a day (QID) | ORAL | Status: DC | PRN

## 2012-03-01 MED ORDER — BENZOCAINE-MENTHOL 20-0.5 % EX AERO
1.0000 "application " | INHALATION_SPRAY | CUTANEOUS | Status: DC | PRN
  Filled 2012-03-01 (×2): qty 56

## 2012-03-01 MED ORDER — LACTATED RINGERS IV SOLN
INTRAVENOUS | Status: DC
  Administered 2012-03-01: 08:00:00 via INTRAUTERINE

## 2012-03-01 MED ORDER — MISOPROSTOL 200 MCG PO TABS
ORAL_TABLET | ORAL | Status: AC
  Administered 2012-03-01: 1000 ug
  Filled 2012-03-01: qty 5

## 2012-03-01 MED ORDER — FENTANYL CITRATE 0.05 MG/ML IJ SOLN
100.0000 ug | INTRAMUSCULAR | Status: DC | PRN
  Administered 2012-03-01 (×2): 100 ug via INTRAVENOUS
  Filled 2012-03-01 (×2): qty 2

## 2012-03-01 MED ORDER — MISOPROSTOL 200 MCG PO TABS: 1000.0000 ug | ORAL_TABLET | Freq: Once | ORAL | Status: DC

## 2012-03-01 MED ORDER — SENNOSIDES-DOCUSATE SODIUM 8.6-50 MG PO TABS
2.0000 | ORAL_TABLET | Freq: Every day | ORAL | Status: DC
  Administered 2012-03-01: 2 via ORAL

## 2012-03-01 MED ORDER — IBUPROFEN 600 MG PO TABS
600.0000 mg | ORAL_TABLET | Freq: Four times a day (QID) | ORAL | Status: DC
  Administered 2012-03-01 – 2012-03-03 (×8): 600 mg via ORAL
  Filled 2012-03-01 (×9): qty 1

## 2012-03-01 MED ORDER — LANOLIN HYDROUS EX OINT: TOPICAL_OINTMENT | CUTANEOUS | Status: DC | PRN

## 2012-03-01 MED ORDER — ONDANSETRON HCL 4 MG/2ML IJ SOLN: 4.0000 mg | INTRAMUSCULAR | Status: DC | PRN

## 2012-03-01 MED ORDER — WITCH HAZEL-GLYCERIN EX PADS: 1.0000 "application " | MEDICATED_PAD | CUTANEOUS | Status: DC | PRN

## 2012-03-01 MED ORDER — DIBUCAINE 1 % RE OINT: 1.0000 "application " | TOPICAL_OINTMENT | RECTAL | Status: DC | PRN

## 2012-03-01 MED ORDER — TETANUS-DIPHTH-ACELL PERTUSSIS 5-2.5-18.5 LF-MCG/0.5 IM SUSP: 0.5000 mL | Freq: Once | INTRAMUSCULAR | Status: DC

## 2012-03-01 MED ORDER — SIMETHICONE 80 MG PO CHEW: 80.0000 mg | CHEWABLE_TABLET | ORAL | Status: DC | PRN

## 2012-03-01 MED ORDER — OXYCODONE-ACETAMINOPHEN 5-325 MG PO TABS
1.0000 | ORAL_TABLET | ORAL | Status: DC | PRN
  Administered 2012-03-01 – 2012-03-02 (×3): 1 via ORAL
  Administered 2012-03-03: 2 via ORAL
  Administered 2012-03-03: 1 via ORAL
  Filled 2012-03-01 (×4): qty 1
  Filled 2012-03-01: qty 2

## 2012-03-01 MED ORDER — ONDANSETRON HCL 4 MG PO TABS: 4.0000 mg | ORAL_TABLET | ORAL | Status: DC | PRN

## 2012-03-01 NOTE — Progress Notes (Signed)
Called by RN to review FHR tracing, pt with recurrent variables but otherwise reactive. Bolus already started. Will stop Pitocin and monitor. Plan to recheck cervix in the next half hour or so; may be ready to try starting to push soon.  Bobbye Morton, MD PGY-1, [facility]

## 2012-03-01 NOTE — Progress Notes (Signed)
Tina Knox is a 21 y.o. [id number] at [redacted]w[redacted]d  Subjective:   Objective: BP 112/74  Pulse 92  Temp 98.8 F (37.1 C) (Oral)  Resp 18  Ht 5\' 2"  (1.575 m)  Wt 73.483 kg (162 lb)  BMI 29.63 kg/m2  SpO2 100%  LMP 06/06/2011  Breastfeeding? Unknown      FHT:  FHR: 135 bpm, variability: moderate,  accelerations:  Present,  decelerations:  Present occasional variable UC:   regular, every 3-5 minutes SVE:   Dilation: 6 Effacement (%): 70 Station: -1 Exam by:: dr street  Labs: Lab Results  Component Value Date   WBC 8.4 02/28/2012   HGB 9.4* 02/28/2012   HCT 29.2* 02/28/2012   MCV 80.4 02/28/2012   PLT 237 02/28/2012    Assessment / Plan: Augmentation of labor, progressing well  Labor: Progressing on Pitocin 10 milliunits, will consider AROM Preeclampsia:  no plans for magnesium Fetal Wellbeing:  Category II Pain Control:  Labor support without medications I/D:  PCN Anticipated MOD:  NSVD  Street, Christopher 03/01/2012, 12:34 AM
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
## 2012-03-01 NOTE — Progress Notes (Signed)
Tina Knox is a 21 y.o. [id number] at [redacted]w[redacted]d   Subjective: Breathing with ctx; receiving Fentanyl on a regular basis; Pitocin off due to variables  Objective: BP 131/78  Pulse 82  Temp 98.4 F (36.9 C) (Oral)  Resp 22  Ht 5\' 2"  (1.575 m)  Wt 73.483 kg (162 lb)  BMI 29.63 kg/m2  SpO2 100%  LMP 06/06/2011  Breastfeeding? Unknown      FHT:  FHR: 125 bpm, variability: moderate,  accelerations:  Present,  decelerations:  Absent- at times with deep early variables  UC:   regular, every 2-4 minutes SVE:   Dilation: 8 Effacement (%): 90 Station: -1 Exam by:: Tate Jerkins,cnm- IUPC inserted  Labs: Lab Results  Component Value Date   WBC 8.4 02/28/2012   HGB 9.4* 02/28/2012   HCT 29.2* 02/28/2012   MCV 80.4 02/28/2012   PLT 237 02/28/2012    Assessment / Plan: Protracted active labor- mostly likely due to inadequate ctx FHR changes  Will start amnioinfusion through IUPC to attempt to keep FHR stable enough for Pitocin to be restarted  Cam Hai 03/01/2012, 7:50 AM
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
## 2012-03-02 ENCOUNTER — Encounter (HOSPITAL_COMMUNITY): Payer: Self-pay | Admitting: Anesthesiology

## 2012-03-02 ENCOUNTER — Inpatient Hospital Stay (HOSPITAL_COMMUNITY): Payer: Medicaid Other | Admitting: Anesthesiology

## 2012-03-02 ENCOUNTER — Encounter (HOSPITAL_COMMUNITY)
Admission: AD | Disposition: A | Discharge status: Home / Self Care | Payer: Self-pay | Source: Ambulatory Visit | Attending: Obstetrics & Gynecology

## 2012-03-02 DIAGNOSIS — Z302 Encounter for sterilization: Secondary | ICD-10-CM

## 2012-03-02 HISTORY — PX: BILATERAL SALPINGECTOMY: SHX5743

## 2012-03-02 LAB — CBC
HCT: 28.3 % — ABNORMAL LOW (ref 36.0–46.0)
MCV: 80.2 fL (ref 78.0–100.0)
Platelets: 206 10*3/uL (ref 150–400)
RBC: 3.53 MIL/uL — ABNORMAL LOW (ref 3.87–5.11)
WBC: 12.6 10*3/uL — ABNORMAL HIGH (ref 4.0–10.5)

## 2012-03-02 LAB — SURGICAL PCR SCREEN: MRSA, PCR: NEGATIVE

## 2012-03-02 SURGERY — SALPINGECTOMY, BILATERAL, OPEN
Anesthesia: Spinal | Site: Abdomen | Laterality: Bilateral | Wound class: Clean Contaminated

## 2012-03-02 MED ORDER — BUPIVACAINE HCL (PF) 0.5 % IJ SOLN
INTRAMUSCULAR | Status: AC
  Filled 2012-03-02: qty 30

## 2012-03-02 MED ORDER — FAMOTIDINE 20 MG PO TABS
40.0000 mg | ORAL_TABLET | Freq: Once | ORAL | Status: AC
  Administered 2012-03-02: 40 mg via ORAL
  Filled 2012-03-02: qty 2

## 2012-03-02 MED ORDER — FENTANYL CITRATE 0.05 MG/ML IJ SOLN
INTRAMUSCULAR | Status: DC | PRN
  Administered 2012-03-02 (×2): 50 ug via INTRAVENOUS

## 2012-03-02 MED ORDER — LACTATED RINGERS IV SOLN
INTRAVENOUS | Status: DC | PRN
  Administered 2012-03-02 (×3): via INTRAVENOUS

## 2012-03-02 MED ORDER — BUPIVACAINE HCL (PF) 0.5 % IJ SOLN
INTRAMUSCULAR | Status: DC | PRN
  Administered 2012-03-02 (×3): 10 mL

## 2012-03-02 MED ORDER — BUPIVACAINE IN DEXTROSE 0.75-8.25 % IT SOLN
INTRATHECAL | Status: DC | PRN
  Administered 2012-03-02: 1 mL via INTRATHECAL

## 2012-03-02 MED ORDER — FENTANYL CITRATE 0.05 MG/ML IJ SOLN
25.0000 ug | INTRAMUSCULAR | Status: DC | PRN
  Administered 2012-03-02: 50 ug via INTRAVENOUS

## 2012-03-02 MED ORDER — PROPOFOL INFUSION 10 MG/ML OPTIME
INTRAVENOUS | Status: DC | PRN
  Administered 2012-03-02: 30 mL via INTRAVENOUS
  Administered 2012-03-02: 10 mL via INTRAVENOUS

## 2012-03-02 MED ORDER — LACTATED RINGERS IV SOLN: INTRAVENOUS | Status: DC

## 2012-03-02 MED ORDER — PROPOFOL 10 MG/ML IV EMUL
INTRAVENOUS | Status: AC
  Filled 2012-03-02: qty 20

## 2012-03-02 MED ORDER — FENTANYL CITRATE 0.05 MG/ML IJ SOLN
INTRAMUSCULAR | Status: AC
  Filled 2012-03-02: qty 2

## 2012-03-02 MED ORDER — MIDAZOLAM HCL 5 MG/5ML IJ SOLN
INTRAMUSCULAR | Status: DC | PRN
  Administered 2012-03-02 (×2): 1 mg via INTRAVENOUS
  Administered 2012-03-02: 2 mg via INTRAVENOUS

## 2012-03-02 MED ORDER — ONDANSETRON HCL 4 MG/2ML IJ SOLN
INTRAMUSCULAR | Status: AC
  Filled 2012-03-02: qty 2

## 2012-03-02 MED ORDER — ONDANSETRON HCL 4 MG/2ML IJ SOLN
INTRAMUSCULAR | Status: DC | PRN
  Administered 2012-03-02: 4 mg via INTRAVENOUS

## 2012-03-02 MED ORDER — FENTANYL CITRATE 0.05 MG/ML IJ SOLN
INTRAMUSCULAR | Status: AC
  Administered 2012-03-02: 50 ug via INTRAVENOUS
  Filled 2012-03-02: qty 2

## 2012-03-02 MED ORDER — METOCLOPRAMIDE HCL 10 MG PO TABS
10.0000 mg | ORAL_TABLET | Freq: Once | ORAL | Status: AC
  Administered 2012-03-02: 10 mg via ORAL
  Filled 2012-03-02: qty 1

## 2012-03-02 MED ORDER — MIDAZOLAM HCL 2 MG/2ML IJ SOLN
INTRAMUSCULAR | Status: AC
  Filled 2012-03-02: qty 2

## 2012-03-02 SURGICAL SUPPLY — 15 items
CHLORAPREP W/TINT 26ML (MISCELLANEOUS) ×3 IMPLANT
CLOTH BEACON ORANGE TIMEOUT ST (SAFETY) ×3 IMPLANT
DRSG COVADERM PLUS 2X2 (GAUZE/BANDAGES/DRESSINGS) ×3 IMPLANT
GLOVE BIO SURGEON STRL SZ 6.5 (GLOVE) ×6 IMPLANT
GOWN PREVENTION PLUS LG XLONG (DISPOSABLE) ×6 IMPLANT
NS IRRIG 1000ML POUR BTL (IV SOLUTION) ×3 IMPLANT
PACK ABDOMINAL MINOR (CUSTOM PROCEDURE TRAY) ×3 IMPLANT
STRIP CLOSURE SKIN 1/4X3 (GAUZE/BANDAGES/DRESSINGS) ×3 IMPLANT
SUT CHROMIC 0 CT 1 (SUTURE) IMPLANT
SUT VIC AB 0 CT1 27 (SUTURE) ×1
SUT VIC AB 0 CT1 27XBRD ANBCTR (SUTURE) ×2 IMPLANT
SUT VICRYL 4-0 PS2 18IN ABS (SUTURE) ×3 IMPLANT
TOWEL OR 17X24 6PK STRL BLUE (TOWEL DISPOSABLE) ×6 IMPLANT
TRAY FOLEY CATH 14FR (SET/KITS/TRAYS/PACK) ×3 IMPLANT
WATER STERILE IRR 1000ML POUR (IV SOLUTION) ×3 IMPLANT

## 2012-03-02 NOTE — Anesthesia Preprocedure Evaluation (Signed)
Anesthesia Evaluation  Patient identified by MRN, date of birth, ID band Patient awake    Reviewed: Allergy & Precautions, H&P , NPO status , Patient's Chart, lab work & pertinent test results, reviewed documented beta blocker date and time   History of Anesthesia Complications Negative for: history of anesthetic complications  Airway Mallampati: I TM Distance: >3 FB Neck ROM: full    Dental  (+) Teeth Intact   Pulmonary Current Smoker,  breath sounds clear to auscultation        Cardiovascular negative cardio ROS  Rhythm:regular Rate:Normal     Neuro/Psych negative neurological ROS  negative psych ROS   GI/Hepatic negative GI ROS, (+)     substance abuse  marijuana use,   Endo/Other  negative endocrine ROS  Renal/GU negative Renal ROS     Musculoskeletal   Abdominal   Peds  Hematology  (+) Blood dyscrasia (hgb 9), anemia ,   Anesthesia Other Findings   Reproductive/Obstetrics (+) Pregnancy (SVD x3, last at 0907 on 03/01/12)                           Anesthesia Physical Anesthesia Plan  ASA: II  Anesthesia Plan: Spinal   Post-op Pain Management:    Induction:   Airway Management Planned:   Additional Equipment:   Intra-op Plan:   Post-operative Plan:   Informed Consent: I have reviewed the patients History and Physical, chart, labs and discussed the procedure including the risks, benefits and alternatives for the proposed anesthesia with the patient or authorized representative who has indicated his/her understanding and acceptance.     Plan Discussed with: Surgeon and CRNA  Anesthesia Plan Comments:         Anesthesia Quick Evaluation

## 2012-03-02 NOTE — Preoperative (Signed)
Beta Blockers   Reason not to administer Beta Blockers:Not Applicable 

## 2012-03-02 NOTE — Transfer of Care (Signed)
Immediate Anesthesia Transfer of Care Note  Patient: Tina Knox  Procedure(s) Performed: Procedure(s) (LRB) with comments: BILATERAL SALPINGECTOMY (Bilateral)  Patient Location: PACU  Anesthesia Type:Spinal  Level of Consciousness: awake, oriented and patient cooperative  Airway & Oxygen Therapy: Patient Spontanous Breathing  Post-op Assessment: Report given to PACU RN and Post -op Vital signs reviewed and stable  Post vital signs: Reviewed and stable  Complications: No apparent anesthesia complications

## 2012-03-02 NOTE — Progress Notes (Signed)
Patient arrived in [place] via Agricultural consultant at 1005.  Patient has an IV 18g angiocath to right hand, patent, 700cc LR Hanging in SS upon arrival.

## 2012-03-02 NOTE — Anesthesia Postprocedure Evaluation (Signed)
Anesthesia Post Note  Patient: Building surveyor  Procedure(s) Performed: Procedure(s) (LRB): BILATERAL SALPINGECTOMY (Bilateral)  Anesthesia type: General  Patient location: PACU  Post pain: Pain level controlled  Post assessment: Post-op Vital signs reviewed  Last Vitals:  Filed Vitals:   03/02/12 1145  BP: 101/64  Pulse: 59  Temp:   Resp: 20    Post vital signs: Reviewed  Level of consciousness: sedated  Complications: No apparent anesthesia complications

## 2012-03-02 NOTE — Anesthesia Procedure Notes (Signed)
Spinal  Patient location during procedure: OR Start time: 03/02/2012 10:33 AM Staffing Performed by: anesthesiologist  Preanesthetic Checklist Completed: patient identified, site marked, surgical consent, pre-op evaluation, timeout performed, IV checked, risks and benefits discussed and monitors and equipment checked Spinal Block Patient position: sitting Prep: site prepped and draped and DuraPrep Patient monitoring: heart rate, cardiac monitor, continuous pulse ox and blood pressure Approach: midline Location: L3-4 Injection technique: single-shot Needle Needle type: Sprotte  Needle gauge: 24 G Needle length: 9 cm Assessment Sensory level: T4 Additional Notes Clear free flow CSF on first attempt.  No paresthesia.  Patient tolerated procedure well.  Jasmine December, MD

## 2012-03-02 NOTE — H&P (Signed)
21 yo P3 now PPD #1 s/p NSVD. She wants permanent sterility. She declines alternative forms of birth control. She signed her PPS form 01-20-12. We discussed the new recommendations to do a bilateral salpingectomy for permanent sterility to help decrease the risk of ovarian/peritoneal cancer. She understands that I may need to use Fische clips instead if the pelvic vasculature won't safely allow a salpingectomy. She also understands the failure rate.  PE- WNWNBF NAD  Heart- rrr Lungs- CTAB Abd- pp, benign  A/P. Desires permanent sterility. Salpingectomy versus possible tubal ligation, depending on anatomy.

## 2012-03-02 NOTE — Progress Notes (Signed)
Post Partum Day 1 Subjective: no complaints, up ad lib, voiding, tolerating PO, + flatus and +BMx3  Objective: Blood pressure 106/69, pulse 67, temperature 97.6 F (36.4 C), temperature source Oral, resp. rate 18, height 5\' 2"  (1.575 m), weight 73.483 kg (162 lb), last menstrual period 06/06/2011, SpO2 100.00%, unknown if currently breastfeeding.  Physical Exam:  General: alert, cooperative and no distress Lochia: appropriate Uterine Fundus: firm Incision: NA DVT Evaluation: No evidence of DVT seen on physical exam.   Basename 03/02/12 0530 02/28/12 2340  HGB 9.0* 9.4*  HCT 28.3* 29.2*    Assessment/Plan: Plan for discharge tomorrow and Contraception BTL   LOS: 3 days   Tawnya Crook 03/02/2012, 7:53 AM

## 2012-03-02 NOTE — Op Note (Signed)
02/28/2012 - 03/02/2012  11:27 AM  PATIENT:  Tina Knox  21 y.o. female  PRE-OPERATIVE DIAGNOSIS:  Desires Sterilization  POST-OPERATIVE DIAGNOSIS:  Desires Sterilization  PROCEDURE:  BILATERAL PARTIAL SALPINGECTOMY  SURGEON:  Surgeon(s) and Role:    * Allie Bossier, MD - Primary  PHYSICIAN ASSISTANT:   ASSISTANTS: none   ANESTHESIA:   spinal  EBL:  Total I/O In: 1000 [I.V.:1000] Out: -   BLOOD ADMINISTERED:none  DRAINS: none   LOCAL MEDICATIONS USED:  MARCAINE     SPECIMEN:  Source of Specimen:  oviducts  DISPOSITION OF SPECIMEN:  PATHOLOGY  COUNTS:  YES  TOURNIQUET:  * No tourniquets in log *  DICTATION: .Dragon Dictation  PLAN OF CARE: Admit for overnight observation  PATIENT DISPOSITION:  PACU - hemodynamically stable.   Delay start of Pharmacological VTE agent (>24hrs) due to surgical blood loss or risk of bleeding: not applicable  The risks, benefits, and alternatives of surgery were explained, understood, and accepted. I discussed the failure rate of a tubal ligation of up to one in 200 people. She accepts all risks of surgery and wishes to proceed. Consents were signed, and all questions were answered. In the operating room spinal anesthesia was given without complication. After adequate anesthesia was assured, her abdomen was prepped with ChloraPrep. 10 mL of 0.5% Marcaine were injected into the subcutaneous tissue at the umbilicus. A transverse  incision was made. The fascia was elevated with Coker clamps and fascial incision was made with Mayo scissors. The peritoneum was entered with hemostats. I used retractors to visualize the oviduct on the right. It was grasped with a Babcock clamp and traced to its fimbriated end. I then grasped the mesosalpinx with a Kelly clamp and cut it. It was ligated with 0 vicryl suture. I repeated this process until the tube was excised at the isthmic region.No bleeding was noted. The same procedure was done on the other side.   Both oviducts appeared normal. The fascia was then elevated with Coker clamps and closed with a 0 Vicryl running nonlocking suture. No defects were palpable. The subcuticular closure was done with 4-0 Vicryl suture excellent cosmetic results were obtained. She tolerated the procedure well. She was taken to recovery room in stable condition.

## 2012-03-03 ENCOUNTER — Encounter (HOSPITAL_COMMUNITY): Payer: Self-pay | Admitting: Obstetrics & Gynecology

## 2012-03-03 MED ORDER — IBUPROFEN 600 MG PO TABS: 600.0000 mg | ORAL_TABLET | Freq: Four times a day (QID) | ORAL | Status: DC

## 2012-03-03 MED ORDER — OXYCODONE-ACETAMINOPHEN 5-325 MG PO TABS: 1.0000 | ORAL_TABLET | ORAL | Status: DC | PRN
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
## 2012-03-07 NOTE — Discharge Summary (Signed)
Attestation of Attending Supervision of Advanced Practitioner (CNM/NP): Evaluation and management procedures were performed by the Advanced Practitioner under my supervision and collaboration.  I have reviewed the Advanced Practitioner's note and chart, and I agree with the management and plan.  Tina Knox 03/07/2012 6:07 PM   

## 2012-04-05 ENCOUNTER — Other Ambulatory Visit (HOSPITAL_COMMUNITY)
Admission: RE | Admit: 2012-04-05 | Discharge: 2012-04-05 | Disposition: A | Discharge status: Home / Self Care | Payer: Medicaid Other | Source: Ambulatory Visit | Attending: Family Medicine | Admitting: Family Medicine

## 2012-04-05 ENCOUNTER — Encounter: Payer: Self-pay | Admitting: Family Medicine

## 2012-04-05 ENCOUNTER — Ambulatory Visit (INDEPENDENT_AMBULATORY_CARE_PROVIDER_SITE_OTHER): Payer: Medicaid Other | Admitting: Family Medicine

## 2012-04-05 DIAGNOSIS — N898 Other specified noninflammatory disorders of vagina: Secondary | ICD-10-CM

## 2012-04-05 DIAGNOSIS — N760 Acute vaginitis: Secondary | ICD-10-CM | POA: Insufficient documentation

## 2012-04-05 NOTE — Progress Notes (Signed)
Patient ID: Tina Knox, female   DOB: [birth year], 22 y.o.   MRN: [id number] Subjective:     Tina Knox is a 22 y.o. female who presents for a postpartum visit. She is 5 weeks postpartum following a vacuum-assisted vaginal delivery and bilateral partial salpingectomy for sterilization. I have fully reviewed the prenatal and intrapartum course. The delivery was at 38.2 gestational weeks. Outcome: female infant. Anesthesia: none. Postpartum course has been unremarkable. Baby's course has been normal. Baby is feeding by bottle - [employer] Good Start. Bleeding no bleeding. Stopped bleeding after 1.5 weeks. Bowel function is abnormal: constipated since yesterday. Bladder function is normal. Patient is sexually active. Contraception method is tubal ligation. Postpartum depression screening: negative. White vaginal discharge. No itching.  The following portions of the patient's history were reviewed and updated as appropriate: allergies, current medications, past family history, past medical history, past social history, past surgical history and problem list.  Review of Systems Pertinent items are noted in HPI.   Objective:    BP 126/79  Pulse 76  Temp 97.8 F (36.6 C) (Oral)  Resp 20  Ht 5\' 2"  (1.575 m)  Wt 149 lb 3.2 oz (67.677 kg)  BMI 27.29 kg/m2  Breastfeeding? Unknown  General:  alert, cooperative and no distress   Breasts:  inspection negative, no nipple discharge or bleeding, no masses or nodularity palpable  Lungs: clear to auscultation bilaterally  Heart:  regular rate and rhythm, S1, S2 normal, no murmur, click, rub or gallop  Abdomen: normal bowel sounds, soft, non-tender. Incision at umbilicus is well healed, intact with no erythema. There is a small suture knot protruding from the left side of the incision.   GU:  normal vulva, vagina and cervix. Curdy white discharge present.        Assessment:    5-week postpartum exam. Pap smear not done at today's visit as pt had normal  pap in October 2013.   Plan:    1. Contraception: tubal ligation 2. Constipation:  Colace 3.  Wet prep done for vaginal discharge 3. Follow up in: 1 year  or as needed.

## 2012-04-05 NOTE — Progress Notes (Signed)
Pt reports having a pain on her Lt side yesterday- took ibuprofen w/good relief.

## 2012-04-12 ENCOUNTER — Other Ambulatory Visit: Payer: Self-pay | Admitting: Family Medicine

## 2012-04-12 ENCOUNTER — Telehealth: Payer: Self-pay | Admitting: *Deleted

## 2012-04-12 MED ORDER — METRONIDAZOLE 500 MG PO TABS: 500.0000 mg | ORAL_TABLET | Freq: Two times a day (BID) | ORAL | Status: AC

## 2012-04-12 NOTE — Telephone Encounter (Signed)
Message copied by Gerome Apley on Wed Apr 12, 2012  1:47 PM ------      Message from: FERRY, [place]      Created: Wed Apr 12, 2012  1:22 PM       Pt with BV at postpartum visit. rx for flagyl sent to pharmacy. Please advise pt. Thanks.

## 2012-04-12 NOTE — Telephone Encounter (Signed)
Called Tina Knox and notified her of BV diagnosis and flagyl sent to her pharmacy. No questions answered and voiced understanding.

## 2012-09-27 ENCOUNTER — Encounter (HOSPITAL_COMMUNITY): Payer: Self-pay | Admitting: *Deleted

## 2012-09-27 ENCOUNTER — Emergency Department (HOSPITAL_COMMUNITY)
Admission: EM | Admit: 2012-09-27 | Discharge: 2012-09-27 | Disposition: A | Discharge status: Home / Self Care | Payer: Self-pay | Attending: Emergency Medicine | Admitting: Emergency Medicine

## 2012-09-27 DIAGNOSIS — Z79899 Other long term (current) drug therapy: Secondary | ICD-10-CM | POA: Insufficient documentation

## 2012-09-27 DIAGNOSIS — D649 Anemia, unspecified: Secondary | ICD-10-CM | POA: Insufficient documentation

## 2012-09-27 DIAGNOSIS — M545 Low back pain, unspecified: Secondary | ICD-10-CM | POA: Insufficient documentation

## 2012-09-27 DIAGNOSIS — N644 Mastodynia: Secondary | ICD-10-CM | POA: Insufficient documentation

## 2012-09-27 DIAGNOSIS — Z8619 Personal history of other infectious and parasitic diseases: Secondary | ICD-10-CM | POA: Insufficient documentation

## 2012-09-27 DIAGNOSIS — R110 Nausea: Secondary | ICD-10-CM | POA: Insufficient documentation

## 2012-09-27 DIAGNOSIS — A499 Bacterial infection, unspecified: Secondary | ICD-10-CM | POA: Insufficient documentation

## 2012-09-27 DIAGNOSIS — Z3202 Encounter for pregnancy test, result negative: Secondary | ICD-10-CM | POA: Insufficient documentation

## 2012-09-27 DIAGNOSIS — B9689 Other specified bacterial agents as the cause of diseases classified elsewhere: Secondary | ICD-10-CM | POA: Insufficient documentation

## 2012-09-27 DIAGNOSIS — F17200 Nicotine dependence, unspecified, uncomplicated: Secondary | ICD-10-CM | POA: Insufficient documentation

## 2012-09-27 DIAGNOSIS — N760 Acute vaginitis: Secondary | ICD-10-CM | POA: Insufficient documentation

## 2012-09-27 DIAGNOSIS — N898 Other specified noninflammatory disorders of vagina: Secondary | ICD-10-CM | POA: Insufficient documentation

## 2012-09-27 LAB — CBC WITH DIFFERENTIAL/PLATELET
Basophils Absolute: 0 10*3/uL (ref 0.0–0.1)
Basophils Relative: 1 % (ref 0–1)
Eosinophils Relative: 0 % (ref 0–5)
HCT: 35.1 % — ABNORMAL LOW (ref 36.0–46.0)
MCHC: 32.2 g/dL (ref 30.0–36.0)
MCV: 80.5 fL (ref 78.0–100.0)
Monocytes Absolute: 0.7 10*3/uL (ref 0.1–1.0)
RDW: 14.9 % (ref 11.5–15.5)

## 2012-09-27 LAB — WET PREP, GENITAL: Trich, Wet Prep: NONE SEEN

## 2012-09-27 LAB — URINALYSIS, ROUTINE W REFLEX MICROSCOPIC
Hgb urine dipstick: NEGATIVE
Leukocytes, UA: NEGATIVE
Specific Gravity, Urine: 1.026 (ref 1.005–1.030)
Urobilinogen, UA: 1 mg/dL (ref 0.0–1.0)

## 2012-09-27 LAB — POCT PREGNANCY, URINE: Preg Test, Ur: NEGATIVE

## 2012-09-27 LAB — COMPREHENSIVE METABOLIC PANEL
AST: 20 U/L (ref 0–37)
Albumin: 3.6 g/dL (ref 3.5–5.2)
BUN: 5 mg/dL — ABNORMAL LOW (ref 6–23)
CO2: 27 mEq/L (ref 19–32)
Calcium: 9.6 mg/dL (ref 8.4–10.5)
Creatinine, Ser: 0.6 mg/dL (ref 0.50–1.10)
GFR calc non Af Amer: >90 mL/min (ref >90)

## 2012-09-27 MED ORDER — IBUPROFEN 800 MG PO TABS
800.0000 mg | ORAL_TABLET | Freq: Once | ORAL | Status: AC
  Administered 2012-09-27: 800 mg via ORAL
  Filled 2012-09-27: qty 1

## 2012-09-27 MED ORDER — METRONIDAZOLE 500 MG PO TABS: 500.0000 mg | ORAL_TABLET | Freq: Two times a day (BID) | ORAL | Status: DC

## 2012-09-27 NOTE — Progress Notes (Signed)
[facility] has seen patient and provided her with a list of primary care resources, highlighting [facility]. Also, provided her with a [employer].

## 2012-09-27 NOTE — ED Provider Notes (Signed)
History    CSN: [id number] Arrival date & time 09/27/12  1302  First MD Initiated Contact with Patient 09/27/12 1419     Chief Complaint  Patient presents with  . Abdominal Pain   (Consider location/radiation/quality/duration/timing/severity/associated sxs/prior Treatment) HPI Comments: 22 year old female with a past medical history of trichomoniasis presents to the emergency department complaining of lower abdominal pain radiating to her low back for the past 3 days. Admits to associated thick vaginal discharge, nausea without vomiting. Also complaining of bilateral breast pain. States she gave birth 7 months ago but is not breast feeding. Last menstrual period began 2 weeks ago and was normal. After giving birth she had "4 cm of her tubes removed". Denies fever, chills, increased urinary frequency, urgency or dysuria. She only saw her gynecologist 6 weeks after giving birth and did not have any further followup.  Patient is a 22 y.o. female presenting with abdominal pain. The history is provided by the patient.  Abdominal Pain Associated symptoms include abdominal pain and nausea. Pertinent negatives include no chills, fever or vomiting.   Past Medical History  Diagnosis Date  . Trichomonas   . Anemia     with last pregnancy  . No pertinent past medical history    Past Surgical History  Procedure Laterality Date  . Induced abortion    . No past surgeries    . Bilateral salpingectomy  03/02/2012    Procedure: BILATERAL SALPINGECTOMY;  Surgeon: Allie Bossier, MD;  Location: [facility];  Service: Gynecology;  Laterality: Bilateral;   Family History  Problem Relation Age of Onset  . Other Neg Hx    History  Substance Use Topics  . Smoking status: Current Every Day Smoker -- 0.25 packs/day    Types: Cigarettes  . Smokeless tobacco: Former Neurosurgeon    Quit date: 11/05/2011  . Alcohol Use: No   OB History   Grav Para Term Preterm Abortions TAB SAB Ect Mult Living   4 3 3  1 1    3       Review of Systems  Constitutional: Negative for fever and chills.  Gastrointestinal: Positive for nausea and abdominal pain. Negative for vomiting.  Genitourinary: Positive for vaginal discharge. Negative for dysuria, flank pain, vaginal bleeding, menstrual problem and pelvic pain.  Musculoskeletal: Positive for back pain.  All other systems reviewed and are negative.    Allergies  Review of patient's allergies indicates no known allergies.  Home Medications   Current Outpatient Rx  Name  Route  Sig  Dispense  Refill  . ibuprofen (ADVIL,MOTRIN) 600 MG tablet   Oral   Take 1 tablet (600 mg total) by mouth every 6 (six) hours.   30 tablet   1   . IRON PO   Oral   Take 1 tablet by mouth daily.         Marland Kitchen oxyCODONE-acetaminophen (PERCOCET/ROXICET) 5-325 MG per tablet   Oral   Take 1-2 tablets by mouth every 4 (four) hours as needed (moderate - severe pain).   30 tablet   0   . Prenatal Vit-Fe Fumarate-FA (PRENATAL MULTIVITAMIN) TABS   Oral   Take 1 tablet by mouth daily.          BP 115/55  Pulse 83  Temp(Src) 98.6 F (37 C) (Oral)  Resp 16  Ht 5' 2.5" (1.588 m)  SpO2 100%  LMP 09/10/2012 Physical Exam  Nursing note and vitals reviewed. Constitutional: She is oriented to person, place, and time. She  appears well-developed and well-nourished. No distress.  HENT:  Head: Normocephalic and atraumatic.  Mouth/Throat: Oropharynx is clear and moist.  Eyes: Conjunctivae are normal.  Neck: Normal range of motion. Neck supple.  Cardiovascular: Normal rate, regular rhythm and normal heart sounds.   Pulmonary/Chest: Effort normal and breath sounds normal. No respiratory distress.  Abdominal: Soft. Normal appearance and bowel sounds are normal. She exhibits no distension and no mass. There is tenderness in the suprapubic area. There is no rigidity, no rebound, no guarding and no CVA tenderness.  Genitourinary: Uterus normal. Cervix exhibits no motion tenderness, no  discharge and no friability. Right adnexum displays no mass, no tenderness and no fullness. Left adnexum displays no mass, no tenderness and no fullness. No erythema, tenderness or bleeding around the vagina. Vaginal discharge (white, malodorous) found.  Breasts without discharge, discoloration.  Musculoskeletal: Normal range of motion. She exhibits no edema.  Neurological: She is alert and oriented to person, place, and time.  Skin: Skin is warm and dry. She is not diaphoretic.  Psychiatric: She has a normal mood and affect. Her behavior is normal.    ED Course  Procedures (including critical care time) Labs Reviewed  GC/CHLAMYDIA PROBE AMP  WET PREP, GENITAL  CBC WITH DIFFERENTIAL  COMPREHENSIVE METABOLIC PANEL  URINALYSIS, ROUTINE W REFLEX MICROSCOPIC  POCT PREGNANCY, URINE   No results found. No diagnosis found.  MDM  Patient with vaginal discharge, suprapubic pain. Wet prep with BV. Discharged with flagyl. F/u with gyn regarding her breast pain. Vitals stable, NAD. Return precautions discussed. Patient states understanding of plan and is agreeable.   Trevor Mace, PA-C 09/27/12 1636

## 2012-09-27 NOTE — ED Notes (Signed)
Pt c/o lower abd pain, and lower back pain, also reports bilateral breast pain. States she had a vaginal delivery 7 months ago and had 4cm of her "tubes removed." Also reports white vaginal discharge with odor.

## 2012-09-28 LAB — GC/CHLAMYDIA PROBE AMP: CT Probe RNA: NEGATIVE

## 2012-09-28 NOTE — ED Provider Notes (Signed)
Medical screening examination/treatment/procedure(s) were performed by non-physician practitioner and as supervising physician I was immediately available for consultation/collaboration.  Atley Neubert, MD 09/28/12 0716 

## 2013-09-11 ENCOUNTER — Emergency Department (HOSPITAL_COMMUNITY)
Admission: EM | Admit: 2013-09-11 | Discharge: 2013-09-11 | Disposition: A | Discharge status: Home / Self Care | Payer: Self-pay | Attending: Emergency Medicine | Admitting: Emergency Medicine

## 2013-09-11 ENCOUNTER — Encounter (HOSPITAL_COMMUNITY): Payer: Self-pay | Admitting: Emergency Medicine

## 2013-09-11 DIAGNOSIS — N898 Other specified noninflammatory disorders of vagina: Secondary | ICD-10-CM

## 2013-09-11 DIAGNOSIS — N760 Acute vaginitis: Secondary | ICD-10-CM | POA: Insufficient documentation

## 2013-09-11 DIAGNOSIS — B9689 Other specified bacterial agents as the cause of diseases classified elsewhere: Secondary | ICD-10-CM | POA: Insufficient documentation

## 2013-09-11 DIAGNOSIS — Z3202 Encounter for pregnancy test, result negative: Secondary | ICD-10-CM | POA: Insufficient documentation

## 2013-09-11 DIAGNOSIS — E860 Dehydration: Secondary | ICD-10-CM | POA: Insufficient documentation

## 2013-09-11 DIAGNOSIS — A499 Bacterial infection, unspecified: Secondary | ICD-10-CM | POA: Insufficient documentation

## 2013-09-11 DIAGNOSIS — Z862 Personal history of diseases of the blood and blood-forming organs and certain disorders involving the immune mechanism: Secondary | ICD-10-CM | POA: Insufficient documentation

## 2013-09-11 DIAGNOSIS — F17200 Nicotine dependence, unspecified, uncomplicated: Secondary | ICD-10-CM | POA: Insufficient documentation

## 2013-09-11 LAB — CBC WITH DIFFERENTIAL/PLATELET
Basophils Absolute: 0 10*3/uL (ref 0.0–0.1)
Basophils Relative: 0 % (ref 0–1)
EOS ABS: 0.1 10*3/uL (ref 0.0–0.7)
EOS PCT: 1 % (ref 0–5)
HEMATOCRIT: 33.5 % — AB (ref 36.0–46.0)
HEMOGLOBIN: 10.4 g/dL — AB (ref 12.0–15.0)
LYMPHS ABS: 2.4 10*3/uL (ref 0.7–4.0)
Lymphocytes Relative: 53 % — ABNORMAL HIGH (ref 12–46)
MCH: 24.1 pg — AB (ref 26.0–34.0)
MCHC: 31 g/dL (ref 30.0–36.0)
MCV: 77.5 fL — AB (ref 78.0–100.0)
MONOS PCT: 9 % (ref 3–12)
Monocytes Absolute: 0.4 10*3/uL (ref 0.1–1.0)
Neutro Abs: 1.7 10*3/uL (ref 1.7–7.7)
Neutrophils Relative %: 37 % — ABNORMAL LOW (ref 43–77)
Platelets: 345 10*3/uL (ref 150–400)
RBC: 4.32 MIL/uL (ref 3.87–5.11)
RDW: 17.6 % — ABNORMAL HIGH (ref 11.5–15.5)
WBC: 4.7 10*3/uL (ref 4.0–10.5)

## 2013-09-11 LAB — WET PREP, GENITAL
TRICH WET PREP: NONE SEEN
Yeast Wet Prep HPF POC: NONE SEEN

## 2013-09-11 LAB — HIV ANTIBODY (ROUTINE TESTING W REFLEX): HIV 1&2 Ab, 4th Generation: NONREACTIVE

## 2013-09-11 LAB — POC URINE PREG, ED: PREG TEST UR: NEGATIVE

## 2013-09-11 LAB — URINALYSIS, ROUTINE W REFLEX MICROSCOPIC
Glucose, UA: NEGATIVE mg/dL
Hgb urine dipstick: NEGATIVE
Ketones, ur: NEGATIVE mg/dL
NITRITE: NEGATIVE
PH: 6 (ref 5.0–8.0)
Protein, ur: 100 mg/dL — AB
SPECIFIC GRAVITY, URINE: 1.042 — AB (ref 1.005–1.030)
Urobilinogen, UA: 1 mg/dL (ref 0.0–1.0)

## 2013-09-11 LAB — COMPREHENSIVE METABOLIC PANEL
ALBUMIN: 4.1 g/dL (ref 3.5–5.2)
ALT: 6 U/L (ref 0–35)
AST: 15 U/L (ref 0–37)
Alkaline Phosphatase: 53 U/L (ref 39–117)
BUN: 6 mg/dL (ref 6–23)
CALCIUM: 10.1 mg/dL (ref 8.4–10.5)
CO2: 25 mEq/L (ref 19–32)
CREATININE: 0.76 mg/dL (ref 0.50–1.10)
Chloride: 105 mEq/L (ref 96–112)
GFR calc Af Amer: >90 mL/min (ref >90)
GFR calc non Af Amer: >90 mL/min (ref >90)
Glucose, Bld: 99 mg/dL (ref 70–99)
Potassium: 4.1 mEq/L (ref 3.7–5.3)
Sodium: 143 mEq/L (ref 137–147)
Total Bilirubin: 0.5 mg/dL (ref 0.3–1.2)
Total Protein: 7.6 g/dL (ref 6.0–8.3)

## 2013-09-11 LAB — URINE MICROSCOPIC-ADD ON

## 2013-09-11 LAB — LIPASE, BLOOD: Lipase: 17 U/L (ref 11–59)

## 2013-09-11 MED ORDER — AZITHROMYCIN 250 MG PO TABS
1000.0000 mg | ORAL_TABLET | Freq: Once | ORAL | Status: AC
  Administered 2013-09-11: 1000 mg via ORAL
  Filled 2013-09-11: qty 4

## 2013-09-11 MED ORDER — METRONIDAZOLE 500 MG PO TABS: 500.0000 mg | ORAL_TABLET | Freq: Two times a day (BID) | ORAL | Status: DC

## 2013-09-11 MED ORDER — DOXYCYCLINE HYCLATE 100 MG PO CAPS: 100.0000 mg | ORAL_CAPSULE | Freq: Two times a day (BID) | ORAL | Status: DC

## 2013-09-11 MED ORDER — CEFTRIAXONE SODIUM 250 MG IJ SOLR
250.0000 mg | INTRAMUSCULAR | Status: DC
  Administered 2013-09-11: 250 mg via INTRAMUSCULAR
  Filled 2013-09-11: qty 250

## 2013-09-11 MED ORDER — LIDOCAINE HCL 1 % IJ SOLN
INTRAMUSCULAR | Status: AC
  Administered 2013-09-11: 20 mL via INTRAMUSCULAR
  Filled 2013-09-11: qty 20

## 2013-09-11 MED ORDER — IBUPROFEN 800 MG PO TABS: 800.0000 mg | ORAL_TABLET | Freq: Three times a day (TID) | ORAL | Status: DC

## 2013-09-11 NOTE — ED Notes (Signed)
Pt reports lower abdominal pain and vaginal itching x3 days. Pain 7/10. Denies n/v/d.

## 2013-09-11 NOTE — Progress Notes (Signed)
[facility] provided pt with a list of primary care resources and a [facility] [employer] application to help patient establish primary care.  °

## 2013-09-11 NOTE — ED Provider Notes (Signed)
CSN: [id number]     Arrival date & time 09/11/13  1127 History   First MD Initiated Contact with Patient 09/11/13 1259     Chief Complaint  Patient presents with  . Abdominal Pain  . Vaginal Itching     (Consider location/radiation/quality/duration/timing/severity/associated sxs/prior Treatment) HPI Comments: The patient is a 23 year old female past medical history of Trichomonas present emergency room with a chief complaint of one week of dysuria.  Patient reports associated left lower quadrant discomfort ongoing for one week, described as mild-moderate, constant. She reports abnormal white malodorous vaginal discharge for several days with associated puritis. LNMP 09/08/2013.  She reports she is currently sexually active with 2 female partners. She reports noncompliance with condom use with one partner. She reports a history of tubal ligation.  The patient reports some other symptoms with Trichomonas in the past. NO PCP   Patient is a 23 y.o. female presenting with abdominal pain and vaginal itching. The history is provided by the patient. No language interpreter was used.  Abdominal Pain Associated symptoms: dysuria and vaginal discharge   Associated symptoms: no chills, no constipation, no diarrhea, no fever, no nausea, no vaginal bleeding and no vomiting   Vaginal Itching Associated symptoms include abdominal pain. Pertinent negatives include no chills, fever, nausea or vomiting.    Past Medical History  Diagnosis Date  . Trichomonas   . Anemia     with last pregnancy  . No pertinent past medical history    Past Surgical History  Procedure Laterality Date  . Induced abortion    . No past surgeries    . Bilateral salpingectomy  03/02/2012    Procedure: BILATERAL SALPINGECTOMY;  Surgeon: Allie Bossier, MD;  Location: [facility];  Service: Gynecology;  Laterality: Bilateral;   Family History  Problem Relation Age of Onset  . Other Neg Hx    History  Substance Use Topics  . Smoking  status: Current Every Day Smoker -- 0.25 packs/day    Types: Cigarettes  . Smokeless tobacco: Former Neurosurgeon    Quit date: 11/05/2011  . Alcohol Use: No   OB History   Grav Para Term Preterm Abortions TAB SAB Ect Mult Living   4 3 3  1 1    3      Review of Systems  Constitutional: Negative for fever and chills.  Gastrointestinal: Positive for abdominal pain. Negative for nausea, vomiting, diarrhea, constipation and blood in stool.  Genitourinary: Positive for dysuria and vaginal discharge. Negative for vaginal bleeding.      Allergies  Review of patient's allergies indicates no known allergies.  Home Medications   Prior to Admission medications   Not on File   BP 103/58  Pulse 64  Temp(Src) 97.9 F (36.6 C) (Oral)  Resp 16  SpO2 98% Physical Exam  Nursing note and vitals reviewed. Constitutional: She is oriented to person, place, and time. She appears well-developed and well-nourished.  Non-toxic appearance. She does not have a sickly appearance. She does not appear ill. No distress.  HENT:  Head: Normocephalic and atraumatic.  Eyes: EOM are normal. Pupils are equal, round, and reactive to light. Right eye exhibits no discharge. Left eye exhibits no discharge. No scleral icterus.  Neck: Normal range of motion. Neck supple.  Cardiovascular: Normal rate and regular rhythm.   No murmur heard. No lower extremity edema  Pulmonary/Chest: Effort normal and breath sounds normal. She has no wheezes. She has no rales. She exhibits no tenderness.  Abdominal: Soft. Bowel sounds  are normal. She exhibits no distension. There is tenderness in the left lower quadrant. There is no rebound.  Genitourinary: Uterus is not tender. Cervix exhibits no motion tenderness and no friability. Right adnexum displays no tenderness. Left adnexum displays tenderness.  Moderate amount of yellow to white vaginal discharge and posterior vaginal vault. Right adnexa tenderness to palpation. Chaperone present.    Musculoskeletal: Normal range of motion. She exhibits no edema.  Neurological: She is alert and oriented to person, place, and time.  Skin: Skin is warm and dry. No rash noted. She is not diaphoretic.  Psychiatric: She has a normal mood and affect. Her behavior is normal. Thought content normal.    ED Course  Procedures (including critical care time) Labs Review Results for orders placed during the hospital encounter of 09/11/13  WET PREP, GENITAL      Result Value Ref Range   Yeast Wet Prep HPF POC NONE SEEN  NONE SEEN   Trich, Wet Prep NONE SEEN  NONE SEEN   Clue Cells Wet Prep HPF POC FEW (*) NONE SEEN   WBC, Wet Prep HPF POC TOO NUMEROUS TO COUNT (*) NONE SEEN  COMPREHENSIVE METABOLIC PANEL      Result Value Ref Range   Sodium 143  137 - 147 mEq/L   Potassium 4.1  3.7 - 5.3 mEq/L   Chloride 105  96 - 112 mEq/L   CO2 25  19 - 32 mEq/L   Glucose, Bld 99  70 - 99 mg/dL   BUN 6  6 - 23 mg/dL   Creatinine, Ser 4.090.76  0.50 - 1.10 mg/dL   Calcium 81.110.1  8.4 - 91.410.5 mg/dL   Total Protein 7.6  6.0 - 8.3 g/dL   Albumin 4.1  3.5 - 5.2 g/dL   AST 15  0 - 37 U/L   ALT 6  0 - 35 U/L   Alkaline Phosphatase 53  39 - 117 U/L   Total Bilirubin 0.5  0.3 - 1.2 mg/dL   GFR calc non Af Amer >90  >90 mL/min   GFR calc Af Amer >90  >90 mL/min  CBC WITH DIFFERENTIAL      Result Value Ref Range   WBC 4.7  4.0 - 10.5 K/uL   RBC 4.32  3.87 - 5.11 MIL/uL   Hemoglobin 10.4 (*) 12.0 - 15.0 g/dL   HCT 78.233.5 (*) 95.636.0 - 21.346.0 %   MCV 77.5 (*) 78.0 - 100.0 fL   MCH 24.1 (*) 26.0 - 34.0 pg   MCHC 31.0  30.0 - 36.0 g/dL   RDW 08.617.6 (*) 57.811.5 - 46.915.5 %   Platelets 345  150 - 400 K/uL   Neutrophils Relative % 37 (*) 43 - 77 %   Neutro Abs 1.7  1.7 - 7.7 K/uL   Lymphocytes Relative 53 (*) 12 - 46 %   Lymphs Abs 2.4  0.7 - 4.0 K/uL   Monocytes Relative 9  3 - 12 %   Monocytes Absolute 0.4  0.1 - 1.0 K/uL   Eosinophils Relative 1  0 - 5 %   Eosinophils Absolute 0.1  0.0 - 0.7 K/uL   Basophils Relative 0  0  - 1 %   Basophils Absolute 0.0  0.0 - 0.1 K/uL  LIPASE, BLOOD      Result Value Ref Range   Lipase 17  11 - 59 U/L  URINALYSIS, ROUTINE W REFLEX MICROSCOPIC      Result Value Ref Range   Color, Urine  AMBER (*) YELLOW   APPearance CLOUDY (*) CLEAR   Specific Gravity, Urine 1.042 (*) 1.005 - 1.030   pH 6.0  5.0 - 8.0   Glucose, UA NEGATIVE  NEGATIVE mg/dL   Hgb urine dipstick NEGATIVE  NEGATIVE   Bilirubin Urine SMALL (*) NEGATIVE   Ketones, ur NEGATIVE  NEGATIVE mg/dL   Protein, ur 540100 (*) NEGATIVE mg/dL   Urobilinogen, UA 1.0  0.0 - 1.0 mg/dL   Nitrite NEGATIVE  NEGATIVE   Leukocytes, UA TRACE (*) NEGATIVE  URINE MICROSCOPIC-ADD ON      Result Value Ref Range   Squamous Epithelial / LPF RARE  RARE   WBC, UA 3-6  <3 WBC/hpf   Urine-Other MUCOUS PRESENT    POC URINE PREG, ED      Result Value Ref Range   Preg Test, Ur NEGATIVE  NEGATIVE     MDM   Final diagnoses:  Vaginal discharge  BV (bacterial vaginosis)   Patient presents with dysuria and abnormal vaginal discharge for approximately one week. UA shows dehydration, trace leukocytes and to 6 white rare squamous. Pelvic exam shows moderate amount of vaginal discharge, wet prep shows clue cells and too numerous to count white blood cells given in the left lower quadrant discomfort will treat for PID. Advised condom use with every sexual encounter and to have her female partners tested for STDs, and abstain from sexual intercourse approximately 10 days after completing her antibiotic therapy. Discussed lab results, and treatment plan with the patient. Resources given. Return precautions given. Reports understanding and no other concerns at this time.  Patient is stable for discharge at this time.  Meds given in ED:  Medications  cefTRIAXone (ROCEPHIN) injection 250 mg (not administered)  azithromycin (ZITHROMAX) tablet 1,000 mg (not administered)    New Prescriptions   DOXYCYCLINE (VIBRAMYCIN) 100 MG CAPSULE    Take 1  capsule (100 mg total) by mouth 2 (two) times daily.   IBUPROFEN (ADVIL,MOTRIN) 800 MG TABLET    Take 1 tablet (800 mg total) by mouth 3 (three) times daily with meals.   METRONIDAZOLE (FLAGYL) 500 MG TABLET    Take 1 tablet (500 mg total) by mouth 2 (two) times daily.        Clabe SealLauren M Parker, PA-C 09/12/13 [phone]

## 2013-09-11 NOTE — Discharge Instructions (Signed)
You have been treated in the emergency department for an infection, possibly sexually transmitted. Results of your gonorrhea and chlamydia tests are pending and you will be notified if they are positive. It is very important to practice safe sex and use condoms when sexually active, with every partner and with every sexual encounter. If your results are positive you need to notify all sexual partners so they can be treated as well. The website https://garcia.[email or web address]://www.dontspreadit.com/ can be used to send anonymous text messages or emails to alert sexual contacts. Follow up with your doctor, or OBGYN in regards to today's visit.    Gonorrhea and Chlamydia SYMPTOMS  In females, symptoms may go unnoticed. Symptoms that are more noticeable can include:  Belly (abdominal) pain.  Painful intercourse.  Watery mucous-like discharge from the vagina.  Miscarriage.  Discomfort when urinating.  Inflammation of the rectum.  Abnormal gray-green frothy vaginal discharge  Vaginal itching and irritatio  Itching and irritation of the area outside the vagina.   Painful urination.  Bleeding after sexual intercourse.  In males, symptoms include:  Burning with urination.  Pain in the testicles.  Watery mucous-like discharge from the penis.  It can cause longstanding (chronic) pelvic pain after frequent infections.  TREATMENT  PID can cause women to not be able to have children (sterile) if left untreated or if half-treated.  It is important to finish ALL medications given to you.  This is a sexually transmitted infection. So you are also at risk for other sexually transmitted diseases, including HIV (AIDS), it is recommended that you get tested. HOME CARE INSTRUCTIONS  Warning: This infection is contagious. Do not have sex until treatment is completed. Follow up at your caregiver's office or the clinic to which you were referred. If your diagnosis (learning what is wrong) is confirmed by culture or some other method, your  recent sexual contacts need treatment. Even if they are symptom free or have a negative culture or evaluation, they should be treated.  PREVENTION  Women should use sanitary pads instead of tampons for vaginal discharge.  Wipe front to back after using the toilet and avoid douching.   Practice safe sex, use condoms, have only one sex partner and be sure your sex partner is not having sex with others.  Ask your caregiver to test you for chlamydia at your regular checkups or sooner if you are having symptoms.  Ask for further information if you are pregnant.  SEEK IMMEDIATE MEDICAL CARE IF:  You develop an oral temperature above 102 F (38.9 C), not controlled by medications or lasting more than 2 days.  You develop an increase in pain.  You develop any type of abnormal discharge.  You develop vaginal bleeding and it is not time for your period.  You develop painful intercourse.   Bacterial Vaginosis  Bacterial vaginosis (BV) is a vaginal infection where the normal balance of bacteria in the vagina is disrupted. This is not a sexually transmitted disease and your sexual partners do NOT need to be treated. CAUSES  The cause of BV is not fully understood. BV develops when there is an increase or imbalance of harmful bacteria.  Some activities or behaviors can upset the normal balance of bacteria in the vagina and put women at increased risk including:  Having a new sex partner or multiple sex partners.  Douching.  Using an intrauterine device (IUD) for contraception.  It is not clear what role sexual activity plays in the development of BV. However, women  that have never had sexual intercourse are rarely infected with BV.  Women do not get BV from toilet seats, bedding, swimming pools or from touching objects around them.   SYMPTOMS  Grey vaginal discharge.  A fish-like odor with discharge, especially after sexual intercourse.  Itching or burning of the vagina and vulva.  Burning or pain  with urination.  Some women have no signs or symptoms at all.   TREATMENT  Sometimes BV will clear up without treatment.  BV may be treated with antibiotics.  BV can recur after treatment. If this happens, a second round of antibiotics will often be prescribed.  HOME CARE INSTRUCTIONS  Finish all medication as directed by your caregiver.  Do not have sex until treatment is completed.  Do NOT drink any alcoholic beverages while being treated  with Metronidazole (Flagyl). This will cause a severe reaction inducing vomiting.  RESOURCE GUIDE  Dental Problems  Patients with Medicaid: [facility] [phone] [address]                                           [phone] [address] Phone:  [phone]                                                  Phone:  [phone]  If unable to pay or uninsured, contact:  [facility] or [facility] to become qualified for the adult dental clinic.  Chronic Pain Problems Contact [facility]  [phone] Patients need to be referred by their primary care doctor.  Insufficient Money for Medicine Contact [employer] Way:  call "211" or [facility] 820-170-8499.  No Primary Care Doctor Call [facility]  [phone] Other agencies that provide inexpensive medical care    [facility]  [phone]    [facility]  [phone]    [facility]  [phone]    [facility]  [phone]    [facility]  [phone]    [facility]  [phone]  Psychological Services [facility]  [phone] [facility]  [phone] [facility]   [phone] (emergency services ([phone])  Substance Abuse Resources [facility]  [phone] [facility] [phone] [facility] 7087614843 Floydene Flock [phone] Residential & Outpatient Substance Abuse Program   [phone]  Abuse/Neglect [facility] Child Abuse Hotline 229-711-9554 [facility] Child Abuse Hotline 203-817-0020 (After Hours)  Emergency Shelter [facility] (801)224-9667  M[facility] [place] 906-225-9092 [facility] (984)[phone]  MRSA Hotline #:   972-833-9679    [facility] Resources  [facility] [place]     [employer]                          [facility] Dept. [address] [place]                       [address]  [address]  [place]                                                Cristobal GoldmannWentworth                            [place] Phone:  [phone]                                   Phone:  [phone]                 Phone:  [phone]  [facility] Phone:  [phone]  [facility] Child Abuse Hotline 904-161-4519(336) 902-050-7818 [phone]) [phone] (After Hours)

## 2013-09-12 LAB — URINE CULTURE
CULTURE: NO GROWTH
Colony Count: NO GROWTH

## 2013-09-12 LAB — GC/CHLAMYDIA PROBE AMP
CT Probe RNA: POSITIVE — AB
GC PROBE AMP APTIMA: NEGATIVE

## 2013-09-13 ENCOUNTER — Telehealth (HOSPITAL_COMMUNITY): Payer: Self-pay

## 2013-09-13 NOTE — ED Provider Notes (Signed)
Medical screening examination/treatment/procedure(s) were performed by non-physician practitioner and as supervising physician I was immediately available for consultation/collaboration.  Elliott L Wentz, MD 09/13/13 2230 

## 2013-09-20 ENCOUNTER — Telehealth (HOSPITAL_BASED_OUTPATIENT_CLINIC_OR_DEPARTMENT_OTHER): Payer: Self-pay | Admitting: Emergency Medicine

## 2013-09-20 NOTE — Telephone Encounter (Signed)
ID verified. Patient returned call. Notified of + Chlamydia and that treament was given in ED with Rocephin and Zithromax. STD instructions provided. [facility] faxed.

## 2014-01-21 ENCOUNTER — Encounter (HOSPITAL_COMMUNITY): Payer: Self-pay | Admitting: Emergency Medicine

## 2014-06-27 ENCOUNTER — Telehealth: Payer: Self-pay | Admitting: *Deleted

## 2014-06-27 NOTE — Telephone Encounter (Signed)
Pt presented prescription written 08/2013.Marland Kitchen.Marland Kitchen.Pharmacy called to verify when prescriptions were written.
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
## 2014-10-23 ENCOUNTER — Encounter (HOSPITAL_COMMUNITY): Payer: Self-pay | Admitting: Family Medicine

## 2014-10-23 ENCOUNTER — Emergency Department (HOSPITAL_COMMUNITY)
Admission: EM | Admit: 2014-10-23 | Discharge: 2014-10-23 | Disposition: A | Discharge status: Home / Self Care | Payer: Self-pay | Attending: Emergency Medicine | Admitting: Emergency Medicine

## 2014-10-23 DIAGNOSIS — N898 Other specified noninflammatory disorders of vagina: Secondary | ICD-10-CM | POA: Insufficient documentation

## 2014-10-23 DIAGNOSIS — Z3202 Encounter for pregnancy test, result negative: Secondary | ICD-10-CM | POA: Insufficient documentation

## 2014-10-23 DIAGNOSIS — Z720 Tobacco use: Secondary | ICD-10-CM | POA: Insufficient documentation

## 2014-10-23 DIAGNOSIS — Z862 Personal history of diseases of the blood and blood-forming organs and certain disorders involving the immune mechanism: Secondary | ICD-10-CM | POA: Insufficient documentation

## 2014-10-23 DIAGNOSIS — Z8619 Personal history of other infectious and parasitic diseases: Secondary | ICD-10-CM | POA: Insufficient documentation

## 2014-10-23 LAB — URINE MICROSCOPIC-ADD ON

## 2014-10-23 LAB — URINALYSIS, ROUTINE W REFLEX MICROSCOPIC
Bilirubin Urine: NEGATIVE
Glucose, UA: NEGATIVE mg/dL
Hgb urine dipstick: NEGATIVE
Ketones, ur: >80 mg/dL — AB
NITRITE: NEGATIVE
PH: 6 (ref 5.0–8.0)
PROTEIN: NEGATIVE mg/dL
SPECIFIC GRAVITY, URINE: 1.028 (ref 1.005–1.030)
UROBILINOGEN UA: 1 mg/dL (ref 0.0–1.0)

## 2014-10-23 LAB — WET PREP, GENITAL
Clue Cells Wet Prep HPF POC: NONE SEEN
Trich, Wet Prep: NONE SEEN
Yeast Wet Prep HPF POC: NONE SEEN

## 2014-10-23 LAB — PREGNANCY, URINE: Preg Test, Ur: NEGATIVE

## 2014-10-23 MED ORDER — AZITHROMYCIN 250 MG PO TABS
1000.0000 mg | ORAL_TABLET | Freq: Once | ORAL | Status: AC
  Administered 2014-10-23: 1000 mg via ORAL
  Filled 2014-10-23: qty 4

## 2014-10-23 MED ORDER — CEFTRIAXONE SODIUM 250 MG IJ SOLR
250.0000 mg | Freq: Once | INTRAMUSCULAR | Status: AC
  Administered 2014-10-23: 250 mg via INTRAMUSCULAR
  Filled 2014-10-23: qty 250

## 2014-10-23 NOTE — ED Provider Notes (Signed)
CSN: [id number]     Arrival date & time 10/23/14  1610 History   First MD Initiated Contact with Patient 10/23/14 1756     Chief Complaint  Patient presents with  . Vaginal Discharge    (Consider location/radiation/quality/duration/timing/severity/associated sxs/prior Treatment) The history is provided by the patient and medical records.    This is a 24 year old female with history of anemia and trichomonas, presenting to the ED for vaginal discharge. Patient states 3 days ago she put chocolate syrup in her genital region, her boyfriend performed oral sex and then they had intercourse. She states since this time she has been having a pink discharge from her vagina. She denies any frank blood. She states they had sex again yesterday which was somewhat painful with a "burning" sensation in her vagina. She denies any abdominal pain, nausea, vomiting, or dysuria. No pelvic pain. No fever or chills. Patient is not currently on any type of birth control. Her last menstrual period was 10/12/2014.  Past Medical History  Diagnosis Date  . Trichomonas   . Anemia     with last pregnancy  . No pertinent past medical history    Past Surgical History  Procedure Laterality Date  . Induced abortion    . No past surgeries    . Bilateral salpingectomy  03/02/2012    Procedure: BILATERAL SALPINGECTOMY;  Surgeon: Allie Bossier, MD;  Location: [facility];  Service: Gynecology;  Laterality: Bilateral;   Family History  Problem Relation Age of Onset  . Other Neg Hx    History  Substance Use Topics  . Smoking status: Current Every Day Smoker -- 0.25 packs/day    Types: Cigarettes  . Smokeless tobacco: Former Neurosurgeon    Quit date: 11/05/2011  . Alcohol Use: No   OB History    Gravida Para Term Preterm AB TAB SAB Ectopic Multiple Living   Review of Systems  Genitourinary: Positive for vaginal discharge.  All other systems reviewed and are negative.     Allergies  Review of  patient's allergies indicates no known allergies.  Home Medications   Prior to Admission medications   Not on File   BP 101/57 mmHg  Pulse 85  Temp(Src) 98.9 F (37.2 C) (Oral)  Ht 5' 2.5" (1.588 m)  Wt 139 lb 12.8 oz (63.413 kg)  BMI 25.15 kg/m2  SpO2 98%  LMP 10/15/2014   Physical Exam  Constitutional: She is oriented to person, place, and time. She appears well-developed and well-nourished. No distress.  HENT:  Head: Normocephalic and atraumatic.  Mouth/Throat: Oropharynx is clear and moist.  Eyes: Conjunctivae and EOM are normal. Pupils are equal, round, and reactive to light.  Neck: Normal range of motion. Neck supple.  Cardiovascular: Normal rate, regular rhythm and normal heart sounds.   Pulmonary/Chest: Effort normal and breath sounds normal. No respiratory distress. She has no wheezes.  Abdominal: Soft. Bowel sounds are normal. There is no tenderness. There is no guarding.  Genitourinary: There is no lesion on the right labia. There is no lesion on the left labia. No tenderness or bleeding in the vagina. No foreign body around the vagina. Vaginal discharge found.  Normal female external genitalia without visible lesions; moderate amount of green/yellow vaginal discharge noted; no bleeding or FB noted; no visible vaginal tears or ulcerations; no adnexal or CMT  Musculoskeletal: Normal range of motion. She exhibits no edema.  Neurological: She is alert  and oriented to person, place, and time.  Skin: Skin is warm and dry. She is not diaphoretic.  Psychiatric: She has a normal mood and affect.  Nursing note and vitals reviewed.   ED Course  Procedures (including critical care time) Labs Review Labs Reviewed  WET PREP, GENITAL - Abnormal; Notable for the following:    WBC, Wet Prep HPF POC TOO NUMEROUS TO COUNT (*)    All other components within normal limits  URINALYSIS, ROUTINE W REFLEX MICROSCOPIC (NOT AT [facility]) - Abnormal; Notable for the following:    Ketones, ur  >80 (*)    Leukocytes, UA SMALL (*)    All other components within normal limits  URINE MICROSCOPIC-ADD ON  PREGNANCY, URINE  GC/CHLAMYDIA PROBE AMP () NOT AT [facility]    Imaging Review No results found.   EKG Interpretation None      MDM   Final diagnoses:  Vaginal discharge   24 year old female here with vaginal discharge. She is afebrile, nontoxic. Her abdominal exam is benign. She does have green/yellow discharge noted on exam, no vaginal bleeding. No adnexal or cervical motion tenderness. Urine pregnancy test negative. U/A non-infectious.  Wet prep with too numerous to count WBC's.  Gc/chl pending.  Given her physical exam findings, i do have concern for possible STD.  Patient treated empirically with rocephin and azithromycin.  She was encouraged to FU with health dept for further STD needs.  Discussed plan with patient, he/she acknowledged understanding and agreed with plan of care.  Return precautions given for new or worsening symptoms.  Garlon Hatchet, PA-C 10/23/14 2340  Alvira Monday, MD 10/24/14 [phone]

## 2014-10-23 NOTE — ED Notes (Signed)
Patient reports that she has had pinkish discharge the last couple of days, all after her and

## 2014-10-23 NOTE — ED Notes (Signed)
Pt ambulated to restroom with this RN.   

## 2014-10-23 NOTE — ED Notes (Signed)
Pt here for pink/gooey  discharge from vaginal area. sts this started after sex and using chocolate.

## 2014-10-23 NOTE — Discharge Instructions (Signed)
You will be notified tomorrow if your culture results are positive. Follow-up with the health department as needed. Return to the ED for new or worsening symptoms.

## 2014-10-24 LAB — GC/CHLAMYDIA PROBE AMP (~~LOC~~) NOT AT ARMC
Chlamydia: POSITIVE — AB
Neisseria Gonorrhea: NEGATIVE

## 2014-10-25 ENCOUNTER — Telehealth (HOSPITAL_COMMUNITY): Payer: Self-pay | Admitting: Emergency Medicine

## 2014-10-25 NOTE — Telephone Encounter (Signed)
Positive Chlamydia culture Treated with Zithromax and Rocephin per protocol MD [facility] faxed  Patient contacted 10/25/14 @ 1020 ID verified, patient notified of positive Chlamydia and that treatment was given while in ED. STD instructions given, patient verbalized understanding.

## 2015-07-18 ENCOUNTER — Emergency Department (HOSPITAL_COMMUNITY)
Admission: EM | Admit: 2015-07-18 | Discharge: 2015-07-19 | Disposition: A | Discharge status: Home / Self Care | Payer: Self-pay | Attending: Emergency Medicine | Admitting: Emergency Medicine

## 2015-07-18 ENCOUNTER — Emergency Department (HOSPITAL_COMMUNITY): Payer: Self-pay

## 2015-07-18 ENCOUNTER — Encounter (HOSPITAL_COMMUNITY): Payer: Self-pay

## 2015-07-18 DIAGNOSIS — R110 Nausea: Secondary | ICD-10-CM | POA: Insufficient documentation

## 2015-07-18 DIAGNOSIS — Z3202 Encounter for pregnancy test, result negative: Secondary | ICD-10-CM | POA: Insufficient documentation

## 2015-07-18 DIAGNOSIS — J069 Acute upper respiratory infection, unspecified: Secondary | ICD-10-CM | POA: Insufficient documentation

## 2015-07-18 DIAGNOSIS — B9789 Other viral agents as the cause of diseases classified elsewhere: Secondary | ICD-10-CM

## 2015-07-18 DIAGNOSIS — F17210 Nicotine dependence, cigarettes, uncomplicated: Secondary | ICD-10-CM | POA: Insufficient documentation

## 2015-07-18 DIAGNOSIS — Z8619 Personal history of other infectious and parasitic diseases: Secondary | ICD-10-CM | POA: Insufficient documentation

## 2015-07-18 LAB — COMPREHENSIVE METABOLIC PANEL
ALT: 13 U/L — ABNORMAL LOW (ref 14–54)
AST: 24 U/L (ref 15–41)
Albumin: 3.8 g/dL (ref 3.5–5.0)
Alkaline Phosphatase: 41 U/L (ref 38–126)
Anion gap: 9 (ref 5–15)
BUN: <5 mg/dL — ABNORMAL LOW (ref 6–20)
CALCIUM: 9.5 mg/dL (ref 8.9–10.3)
CHLORIDE: 105 mmol/L (ref 101–111)
CO2: 22 mmol/L (ref 22–32)
Creatinine, Ser: 0.83 mg/dL (ref 0.44–1.00)
Glucose, Bld: 124 mg/dL — ABNORMAL HIGH (ref 65–99)
Potassium: 4.4 mmol/L (ref 3.5–5.1)
Sodium: 136 mmol/L (ref 135–145)
Total Bilirubin: 0.4 mg/dL (ref 0.3–1.2)
Total Protein: 7.1 g/dL (ref 6.5–8.1)

## 2015-07-18 LAB — CBC WITH DIFFERENTIAL/PLATELET
BASOS PCT: 0 %
Basophils Absolute: 0 10*3/uL (ref 0.0–0.1)
EOS PCT: 0 %
Eosinophils Absolute: 0 10*3/uL (ref 0.0–0.7)
HCT: 28.3 % — ABNORMAL LOW (ref 36.0–46.0)
Hemoglobin: 8.4 g/dL — ABNORMAL LOW (ref 12.0–15.0)
Lymphocytes Relative: 16 %
Lymphs Abs: 1.4 10*3/uL (ref 0.7–4.0)
MCH: 21.4 pg — AB (ref 26.0–34.0)
MCHC: 29.7 g/dL — AB (ref 30.0–36.0)
MCV: 72 fL — ABNORMAL LOW (ref 78.0–100.0)
MONO ABS: 1 10*3/uL (ref 0.1–1.0)
Monocytes Relative: 12 %
NEUTROS ABS: 6.1 10*3/uL (ref 1.7–7.7)
Neutrophils Relative %: 72 %
PLATELETS: 457 10*3/uL — AB (ref 150–400)
RBC: 3.93 MIL/uL (ref 3.87–5.11)
RDW: 20.5 % — ABNORMAL HIGH (ref 11.5–15.5)
WBC: 8.5 10*3/uL (ref 4.0–10.5)

## 2015-07-18 LAB — I-STAT BETA HCG BLOOD, ED (MC, WL, AP ONLY)

## 2015-07-18 LAB — RAPID STREP SCREEN (MED CTR MEBANE ONLY): STREPTOCOCCUS, GROUP A SCREEN (DIRECT): NEGATIVE

## 2015-07-18 LAB — I-STAT CG4 LACTIC ACID, ED: LACTIC ACID, VENOUS: 0.97 mmol/L (ref 0.5–2.0)

## 2015-07-18 MED ORDER — IBUPROFEN 600 MG PO TABS: 600.0000 mg | ORAL_TABLET | Freq: Four times a day (QID) | ORAL | Status: DC | PRN

## 2015-07-18 MED ORDER — ACETAMINOPHEN 325 MG PO TABS
650.0000 mg | ORAL_TABLET | Freq: Once | ORAL | Status: AC | PRN
  Administered 2015-07-18: 650 mg via ORAL

## 2015-07-18 MED ORDER — METOCLOPRAMIDE HCL 5 MG/ML IJ SOLN
10.0000 mg | INTRAMUSCULAR | Status: AC
  Administered 2015-07-18: 10 mg via INTRAVENOUS
  Filled 2015-07-18: qty 2

## 2015-07-18 MED ORDER — ACETAMINOPHEN 325 MG PO TABS
ORAL_TABLET | ORAL | Status: AC
  Filled 2015-07-18: qty 2

## 2015-07-18 MED ORDER — BENZONATATE 100 MG PO CAPS: 100.0000 mg | ORAL_CAPSULE | Freq: Three times a day (TID) | ORAL | Status: DC | PRN

## 2015-07-18 MED ORDER — SALINE SPRAY 0.65 % NA SOLN: 1.0000 | NASAL | Status: DC | PRN

## 2015-07-18 MED ORDER — SODIUM CHLORIDE 0.9 % IV BOLUS (SEPSIS)
1000.0000 mL | Freq: Once | INTRAVENOUS | Status: AC
  Administered 2015-07-18: 1000 mL via INTRAVENOUS

## 2015-07-18 MED ORDER — KETOROLAC TROMETHAMINE 30 MG/ML IJ SOLN
30.0000 mg | Freq: Once | INTRAMUSCULAR | Status: AC
Start: 2015-07-18 — End: 2015-07-18
  Administered 2015-07-18: 30 mg via INTRAVENOUS
  Filled 2015-07-18: qty 1

## 2015-07-18 NOTE — ED Notes (Signed)
Pt verbalized understanding of follow-up care and discharge instructions.

## 2015-07-18 NOTE — ED Notes (Signed)
Pt here with c/o generalized body aches, chills and sore throat, onset yesterday. Pt denies vomiting or diarrhea.

## 2015-07-18 NOTE — Discharge Instructions (Signed)
Upper Respiratory Infection, Adult Most upper respiratory infections (URIs) are a viral infection of the air passages leading to the lungs. A URI affects the nose, throat, and upper air passages. The most common type of URI is nasopharyngitis and is typically referred to as "the common cold." URIs run their course and usually go away on their own. Most of the time, a URI does not require medical attention, but sometimes a bacterial infection in the upper airways can follow a viral infection. This is called a secondary infection. Sinus and middle ear infections are common types of secondary upper respiratory infections. Bacterial pneumonia can also complicate a URI. A URI can worsen asthma and chronic obstructive pulmonary disease (COPD). Sometimes, these complications can require emergency medical care and may be life threatening.  CAUSES Almost all URIs are caused by viruses. A virus is a type of germ and can spread from one person to another.  RISKS FACTORS You may be at risk for a URI if:   You smoke.   You have chronic heart or lung disease.  You have a weakened defense (immune) system.   You are very young or very old.   You have nasal allergies or asthma.  You work in crowded or poorly ventilated areas.  You work in health care facilities or schools. SIGNS AND SYMPTOMS  Symptoms typically develop 2-3 days after you come in contact with a cold virus. Most viral URIs last 7-10 days. However, viral URIs from the influenza virus (flu virus) can last 14-18 days and are typically more severe. Symptoms may include:   Runny or stuffy (congested) nose.   Sneezing.   Cough.   Sore throat.   Headache.   Fatigue.   Fever.   Loss of appetite.   Pain in your forehead, behind your eyes, and over your cheekbones (sinus pain).  Muscle aches.  DIAGNOSIS  Your health care provider may diagnose a URI by:  Physical exam.  Tests to check that your symptoms are not due to  another condition such as:  Strep throat.  Sinusitis.  Pneumonia.  Asthma. TREATMENT  A URI goes away on its own with time. It cannot be cured with medicines, but medicines may be prescribed or recommended to relieve symptoms. Medicines may help:  Reduce your fever.  Reduce your cough.  Relieve nasal congestion. HOME CARE INSTRUCTIONS   Take medicines only as directed by your health care provider.   Gargle warm saltwater or take cough drops to comfort your throat as directed by your health care provider.  Use a warm mist humidifier or inhale steam from a shower to increase air moisture. This may make it easier to breathe.  Drink enough fluid to keep your urine clear or pale yellow.   Eat soups and other clear broths and maintain good nutrition.   Rest as needed.   Return to work when your temperature has returned to normal or as your health care provider advises. You may need to stay home longer to avoid infecting others. You can also use a face mask and careful hand washing to prevent spread of the virus.  Increase the usage of your inhaler if you have asthma.   Do not use any tobacco products, including cigarettes, chewing tobacco, or electronic cigarettes. If you need help quitting, ask your health care provider. PREVENTION  The best way to protect yourself from getting a cold is to practice good hygiene.   Avoid oral or hand contact with people with cold   symptoms.   Wash your hands often if contact occurs.  There is no clear evidence that vitamin C, vitamin E, echinacea, or exercise reduces the chance of developing a cold. However, it is always recommended to get plenty of rest, exercise, and practice good nutrition.  SEEK MEDICAL CARE IF:   You are getting worse rather than better.   Your symptoms are not controlled by medicine.   You have chills.  You have worsening shortness of breath.  You have brown or red mucus.  You have yellow or brown nasal  discharge.  You have pain in your face, especially when you bend forward.  You have a fever.  You have swollen neck glands.  You have pain while swallowing.  You have white areas in the back of your throat. SEEK IMMEDIATE MEDICAL CARE IF:   You have severe or persistent:  Headache.  Ear pain.  Sinus pain.  Chest pain.  You have chronic lung disease and any of the following:  Wheezing.  Prolonged cough.  Coughing up blood.  A change in your usual mucus.  You have a stiff neck.  You have changes in your:  Vision.  Hearing.  Thinking.  Mood. MAKE SURE YOU:   Understand these instructions.  Will watch your condition.  Will get help right away if you are not doing well or get worse.   This information is not intended to replace advice given to you by your health care provider. Make sure you discuss any questions you have with your health care provider.   Document Released: 09/01/2000 Document Revised: 07/23/2014 Document Reviewed: 06/13/2013 Elsevier Interactive Patient Education 2016 [employer]  

## 2015-07-18 NOTE — ED Provider Notes (Signed)
CSN: [id number]     Arrival date & time 07/18/15  1932 History   First MD Initiated Contact with Patient 07/18/15 2152     Chief Complaint  Patient presents with  . Generalized Body Aches     (Consider location/radiation/quality/duration/timing/severity/associated sxs/prior Treatment) HPI Comments: 25 year old female with a history of anemia presents to the emergency department for evaluation of sore throat. Patient states that sore throat began yesterday, primarily as a pain on the left side of her posterior throat when swallowing. She states that her sore throat worsened and became associated with nasal congestion and rhinorrhea. Patient states that she also developed some chills and generalized body aches yesterday evening before going to bed. She complains of a global headache, currently, as well as sore throat and cough. Patient was found to have a fever of 100.64F in triage. She states that she took a tablet of Vicodin for her symptoms, but denies any other over-the-counter remedies. Patient denies sick contacts. She has had no vomiting or diarrhea, though she does report some nausea. No reported hemoptysis, inability to swallow, or drooling.  The history is provided by the patient. No language interpreter was used.    Past Medical History  Diagnosis Date  . Trichomonas   . Anemia     with last pregnancy  . No pertinent past medical history    Past Surgical History  Procedure Laterality Date  . Induced abortion    . No past surgeries    . Bilateral salpingectomy  03/02/2012    Procedure: BILATERAL SALPINGECTOMY;  Surgeon: Allie Bossier, MD;  Location: [facility];  Service: Gynecology;  Laterality: Bilateral;   Family History  Problem Relation Age of Onset  . Other Neg Hx    Social History  Substance Use Topics  . Smoking status: Current Every Day Smoker -- 0.25 packs/day    Types: Cigarettes  . Smokeless tobacco: Former Neurosurgeon    Quit date: 11/05/2011  . Alcohol Use: No   OB  History    Gravida Para Term Preterm AB TAB SAB Ectopic Multiple Living   Review of Systems  Constitutional: Positive for fever, chills and fatigue.  HENT: Positive for congestion, rhinorrhea and sore throat.   Respiratory: Positive for cough. Negative for shortness of breath.   Gastrointestinal: Positive for nausea. Negative for vomiting and diarrhea.  Musculoskeletal: Positive for myalgias.  Neurological: Negative for syncope.  All other systems reviewed and are negative.   Allergies  Review of patient's allergies indicates no known allergies.  Home Medications   Prior to Admission medications   Medication Sig Start Date End Date Taking? Authorizing Provider  benzonatate (TESSALON) 100 MG capsule Take 1 capsule (100 mg total) by mouth 3 (three) times daily as needed for cough. 07/18/15   Antony Madura, PA-C  ibuprofen (ADVIL,MOTRIN) 600 MG tablet Take 1 tablet (600 mg total) by mouth every 6 (six) hours as needed for headache, mild pain or moderate pain (or body aches). 07/18/15   Antony Madura, PA-C  sodium chloride (OCEAN) 0.65 % SOLN nasal spray Place 1 spray into both nostrils as needed. 07/18/15   Antony Madura, PA-C   BP 101/63 mmHg  Pulse 74  Temp(Src) 99.2 F (37.3 C) (Oral)  Resp 20  SpO2 99%  LMP 07/17/2015   Physical Exam  Constitutional: She is oriented to person, place, and time. She appears well-developed and well-nourished. No distress.  Nontoxic-appearing  HENT:  Head: Normocephalic and atraumatic.  Nose: Mucosal edema present. Right sinus exhibits maxillary sinus tenderness and frontal sinus tenderness. Left sinus exhibits maxillary sinus tenderness and frontal sinus tenderness.  Mouth/Throat: Uvula is midline and mucous membranes are normal. Posterior oropharyngeal erythema present. No oropharyngeal exudate or posterior oropharyngeal edema.  Uvula midline. Patient tolerating secretions without difficulty.  Eyes: Conjunctivae and EOM are  normal. Pupils are equal, round, and reactive to light. No scleral icterus.  Neck: Normal range of motion.  No nuchal rigidity or meningismus  Cardiovascular: Normal rate, regular rhythm and intact distal pulses.   Pulmonary/Chest: Effort normal. No respiratory distress. She has no wheezes. She has no rales.  Respirations even and unlabored. Lungs clear to auscultation bilaterally. No wheezing, rales, or rhonchi noted.  Musculoskeletal: Normal range of motion.  Neurological: She is alert and oriented to person, place, and time. She exhibits normal muscle tone. Coordination normal.  GCS 15. Patient moving all extremities.  Skin: Skin is warm and dry. No rash noted. She is not diaphoretic. No erythema. No pallor.  Psychiatric: She has a normal mood and affect. Her behavior is normal.  Nursing note and vitals reviewed.   ED Course  Procedures (including critical care time) Labs Review Labs Reviewed  CBC WITH DIFFERENTIAL/PLATELET - Abnormal; Notable for the following:    Hemoglobin 8.4 (*)    HCT 28.3 (*)    MCV 72.0 (*)    MCH 21.4 (*)    MCHC 29.7 (*)    RDW 20.5 (*)    Platelets 457 (*)    All other components within normal limits  COMPREHENSIVE METABOLIC PANEL - Abnormal; Notable for the following:    Glucose, Bld 124 (*)    BUN <5 (*)    ALT 13 (*)    All other components within normal limits  RAPID STREP SCREEN (NOT AT [facility])  CULTURE, GROUP A STREP ([facility])  I-STAT CG4 LACTIC ACID, ED  I-STAT BETA HCG BLOOD, ED ([facility], [facility], [facility] ONLY)    Imaging Review Dg Chest 2 View  07/18/2015  CLINICAL DATA:  Generalized body aches chills fevers for 2 days EXAM: CHEST  2 VIEW COMPARISON:  None. FINDINGS: The heart size and vascular pattern are normal. No infiltrate or effusion. Mild bilateral perihilar peribronchial wall thickening. IMPRESSION: Mild bronchitic change.  No evidence of pneumonia. Electronically Signed   By: Esperanza Heiraymond  Rubner M.D.   On: 07/18/2015 20:33   I have personally  reviewed and evaluated these images and lab results as part of my medical decision-making.   EKG Interpretation None      11:53 PM Patient reassessed by MD. Reporting significant improvement in symptoms; no complaints. Patient notified of negative strep.  MDM   Final diagnoses:  Viral URI with cough    Patient complaining of symptoms of upper respiratory infection. Mild to moderate symptoms of clear/yellow nasal discharge/congestion and scratchy throat with cough for less than 10 days. Patient is afebrile. No concern for acute bacterial infection; likely viral in nature. Patient discharged with symptomatic treatment. Have advised PCP outpatient follow up. Return precautions discussed and provided. Patient discharged in satisfactory condition with no unaddressed concerns.   Filed Vitals:   07/18/15 1939 07/18/15 2202  BP: 115/73 101/63  Pulse: 98 74  Temp: 100.4 F (38 C) 99.2 F (37.3 C)  TempSrc: Oral Oral  Resp: 16 20  SpO2: 100% 99%     Antony MaduraKelly Lashaunta Sicard, PA-C 07/18/15 2353  Blane OharaJoshua Zavitz, MD 07/19/15 [phone]

## 2015-07-21 LAB — CULTURE, GROUP A STREP (THRC)

## 2015-08-31 ENCOUNTER — Encounter (HOSPITAL_COMMUNITY): Payer: Self-pay

## 2015-08-31 ENCOUNTER — Emergency Department (HOSPITAL_COMMUNITY)
Admission: EM | Admit: 2015-08-31 | Discharge: 2015-08-31 | Disposition: A | Discharge status: Home / Self Care | Payer: Self-pay | Attending: Emergency Medicine | Admitting: Emergency Medicine

## 2015-08-31 DIAGNOSIS — Z791 Long term (current) use of non-steroidal anti-inflammatories (NSAID): Secondary | ICD-10-CM | POA: Insufficient documentation

## 2015-08-31 DIAGNOSIS — B9689 Other specified bacterial agents as the cause of diseases classified elsewhere: Secondary | ICD-10-CM

## 2015-08-31 DIAGNOSIS — F17210 Nicotine dependence, cigarettes, uncomplicated: Secondary | ICD-10-CM | POA: Insufficient documentation

## 2015-08-31 DIAGNOSIS — N760 Acute vaginitis: Secondary | ICD-10-CM | POA: Insufficient documentation

## 2015-08-31 DIAGNOSIS — Z792 Long term (current) use of antibiotics: Secondary | ICD-10-CM | POA: Insufficient documentation

## 2015-08-31 LAB — URINALYSIS, ROUTINE W REFLEX MICROSCOPIC
Bilirubin Urine: NEGATIVE
GLUCOSE, UA: NEGATIVE mg/dL
HGB URINE DIPSTICK: NEGATIVE
Ketones, ur: NEGATIVE mg/dL
LEUKOCYTES UA: NEGATIVE
Nitrite: NEGATIVE
PH: 6.5 (ref 5.0–8.0)
PROTEIN: NEGATIVE mg/dL
SPECIFIC GRAVITY, URINE: 1.013 (ref 1.005–1.030)

## 2015-08-31 LAB — WET PREP, GENITAL
Sperm: NONE SEEN
TRICH WET PREP: NONE SEEN
Yeast Wet Prep HPF POC: NONE SEEN

## 2015-08-31 LAB — POC URINE PREG, ED: Preg Test, Ur: NEGATIVE

## 2015-08-31 MED ORDER — AZITHROMYCIN 250 MG PO TABS
1000.0000 mg | ORAL_TABLET | Freq: Once | ORAL | Status: AC
  Administered 2015-08-31: 1000 mg via ORAL
  Filled 2015-08-31: qty 4

## 2015-08-31 MED ORDER — CEFTRIAXONE SODIUM 250 MG IJ SOLR
250.0000 mg | Freq: Once | INTRAMUSCULAR | Status: AC
  Administered 2015-08-31: 250 mg via INTRAMUSCULAR
  Filled 2015-08-31: qty 250

## 2015-08-31 MED ORDER — LIDOCAINE HCL (PF) 1 % IJ SOLN
1.0000 mL | Freq: Once | INTRAMUSCULAR | Status: AC
  Administered 2015-08-31: 1 mL
  Filled 2015-08-31 (×2): qty 5

## 2015-08-31 MED ORDER — METRONIDAZOLE 500 MG PO TABS: 500.0000 mg | ORAL_TABLET | Freq: Two times a day (BID) | ORAL | Status: DC

## 2015-08-31 NOTE — ED Provider Notes (Signed)
CSN: [id number]     Arrival date & time 08/31/15  1102 History   First MD Initiated Contact with Patient 08/31/15 1122     Chief Complaint  Patient presents with  . Vaginal Discharge     (Consider location/radiation/quality/duration/timing/severity/associated sxs/prior Treatment) HPI Comments: Tina Knox is a 25 y.o. female [phone] with history of anemia, trichomonas, and chlamydia presents to ED with complaint of vaginal discharge. Patient first noticed vaginal discharge approximately three days ago. It is white in color. No odor noted. Patient denies vaginal itching, vaginal pain, or vaginal bleeding. No pelvic pain, hematuria, dysuria, abdominal pain, nausea, vomiting, diarrhea, constipation, fever, chills, or nightsweats. She is sexually active with one female partner in the last 6 months. No barrier contraceptive methods used. Possible concern for STIs, boyfriend complaining of testicles hurting, he is also in ED getting checked for STIs. Patient has history of bilateral salpingectomy and c-section. No other gynecologic history or procedures. No abdominal conditions or surgeries.   Patient is a 25 y.o. female presenting with vaginal discharge. The history is provided by the patient.  Vaginal Discharge Quality:  White Duration:  3 days Associated symptoms: no abdominal pain, no dysuria, no fever, no nausea and no vomiting     Past Medical History  Diagnosis Date  . Trichomonas   . Anemia     with last pregnancy  . No pertinent past medical history    Past Surgical History  Procedure Laterality Date  . Induced abortion    . No past surgeries    . Bilateral salpingectomy  03/02/2012    Procedure: BILATERAL SALPINGECTOMY;  Surgeon: Allie Bossier, MD;  Location: [facility];  Service: Gynecology;  Laterality: Bilateral;   Family History  Problem Relation Age of Onset  . Other Neg Hx    Social History  Substance Use Topics  . Smoking status: Current Every Day Smoker -- 0.25 packs/day     Types: Cigarettes  . Smokeless tobacco: Former Neurosurgeon    Quit date: 11/05/2011  . Alcohol Use: No   OB History    Gravida Para Term Preterm AB TAB SAB Ectopic Multiple Living   Review of Systems  Constitutional: Negative for fever, chills, diaphoresis and fatigue.  Gastrointestinal: Negative for nausea, vomiting, abdominal pain, diarrhea and constipation.  Genitourinary: Positive for vaginal discharge. Negative for dysuria, hematuria, vaginal bleeding, vaginal pain and pelvic pain.  Musculoskeletal: Negative for back pain.      Allergies  Review of patient's allergies indicates no known allergies.  Home Medications   Prior to Admission medications   Medication Sig Start Date End Date Taking? Authorizing Provider  benzonatate (TESSALON) 100 MG capsule Take 1 capsule (100 mg total) by mouth 3 (three) times daily as needed for cough. 07/18/15   Antony Madura, PA-C  ibuprofen (ADVIL,MOTRIN) 600 MG tablet Take 1 tablet (600 mg total) by mouth every 6 (six) hours as needed for headache, mild pain or moderate pain (or body aches). 07/18/15   Antony Madura, PA-C  metroNIDAZOLE (FLAGYL) 500 MG tablet Take 1 tablet (500 mg total) by mouth 2 (two) times daily. 08/31/15   Lona Kettle, PA-C  sodium chloride (OCEAN) 0.65 % SOLN nasal spray Place 1 spray into both nostrils as needed. 07/18/15   Antony Madura, PA-C   BP 104/70 mmHg  Pulse 72  Temp(Src) 99 F (37.2 C)  Resp 15  SpO2 100%  LMP 08/15/2015 Physical  Exam  Constitutional: She appears well-developed and well-nourished. No distress.  HENT:  Head: Normocephalic and atraumatic.  Eyes: Conjunctivae are normal. No scleral icterus.  Cardiovascular: Normal rate, regular rhythm and normal heart sounds.   No murmur heard. Pulmonary/Chest: Effort normal and breath sounds normal. No respiratory distress.  Abdominal: Soft. Bowel sounds are normal. There is no tenderness. There is no rebound and no guarding.   Genitourinary:  Chaperone present for duration of exam. External anatomy normal - no masses, lesions, or rashes noted. Vaginal cavity is normal appearing - no masses, lesions, or ulcerations noted. Cervix is closed with white discharge. No odor noted. No bleeding. No cervical friability. No CMT or masses palpated on bimanual.   Neurological: She is alert. Coordination normal.  Skin: Skin is warm and dry. She is not diaphoretic.  Psychiatric: She has a normal mood and affect. Her behavior is normal.    ED Course  Procedures (including critical care time) Labs Review Labs Reviewed  WET PREP, GENITAL - Abnormal; Notable for the following:    Clue Cells Wet Prep HPF POC PRESENT (*)    WBC, Wet Prep HPF POC MANY (*)    All other components within normal limits  URINALYSIS, ROUTINE W REFLEX MICROSCOPIC (NOT AT [facility])  POC URINE PREG, ED  GC/CHLAMYDIA PROBE AMP ([place]) NOT AT [facility]    Imaging Review No results found. I have personally reviewed and evaluated these images and lab results as part of my medical decision-making.   EKG Interpretation None      MDM   Final diagnoses:  Bacterial vaginosis    Patient is afebrile and non-toxic. Her vital signs are stable. Pelvic exam remarkable for white discharge. Urine pregnancy negative. U/A re-assuring. Wet prep positive for clue cells. GC/Chlamydia pending. Patient concerned for possible STI exposure, would like to be empirically treated for GC/Chlamdyia. TX in ED for GC/chlamydia. Rx for BV. Return precautions provided. Encouraged follow up with PCP. Patient voiced understanding and is agreeable.       Lona Kettleshley Laurel Meyer, [place] JerseyPA-C 08/31/15 2242  Lyndal Pulleyaniel Knott, MD 09/02/15 [phone]

## 2015-08-31 NOTE — Discharge Instructions (Signed)
Read the information below.   You have bacterial vaginosis. You are being treated with antibiotics. Be sure to take the full course of antibiotics. You are also being treated for possible gonorrhea and chlamydia. It is important that you notify partners. Abstain from sexual activity for 7 days following treatment. Make sure your partners are treated as well.  Use the prescribed medication as directed.  Please discuss all new medications with your pharmacist.   Be sure to call and follow up with your PCP in the next week for re-evaluation. I have provided the contact information for [facility] if you need a PCP.  You may return to the Emergency Department at any time for worsening condition or any new symptoms that concern you. Return to ED if you develop fever, abdominal pain, blood in urine, blood in stool, or inability to keep fluids down.    Bacterial Vaginosis Bacterial vaginosis is an infection of the vagina. It happens when too many germs (bacteria) grow in the vagina. Having this infection puts you at risk for getting other infections from sex. Treating this infection can help lower your risk for other infections, such as:   Chlamydia.  Gonorrhea.  HIV.  Herpes. HOME CARE  Take your medicine as told by your doctor.  Finish your medicine even if you start to feel better.  Tell your sex partner that you have an infection. They should see their doctor for treatment.  During treatment:  Avoid sex or use condoms correctly.  Do not douche.  Do not drink alcohol unless your doctor tells you it is ok.  Do not breastfeed unless your doctor tells you it is ok. GET HELP IF:  You are not getting better after 3 days of treatment.  You have more grey fluid (discharge) coming from your vagina than before.  You have more pain than before.  You have a fever. MAKE SURE YOU:   Understand these instructions.  Will watch your condition.  Will get help right  away if you are not doing well or get worse.   This information is not intended to replace advice given to you by your health care provider. Make sure you discuss any questions you have with your health care provider.   Document Released: 12/16/2007 Document Revised: 03/29/2014 Document Reviewed: 10/18/2012 Elsevier Interactive Patient Education 2016 [employer]  Sexually Transmitted Disease A sexually transmitted disease (STD) is a disease or infection often passed to another person during sex. However, STDs can be passed through nonsexual ways. An STD can be passed through:  Spit (saliva).  Semen.  Blood.  Mucus from the vagina.  Pee (urine). HOW CAN I LESSEN MY CHANCES OF GETTING AN STD?  Use:  Latex condoms.  Water-soluble lubricants with condoms. Do not use petroleum jelly or oils.  Dental dams. These are small pieces of latex that are used as a barrier during oral sex.  Avoid having more than one sex partner.  Do not have sex with someone who has other sex partners.  Do not have sex with anyone you do not know or who is at high risk for an STD.  Avoid risky sex that can break your skin.  Do not have sex if you have open sores on your mouth or skin.  Avoid drinking too much alcohol or taking illegal drugs. Alcohol and drugs can affect your good judgment.  Avoid oral and anal sex acts.  Get shots (vaccines) for HPV and hepatitis.  If  you are at risk of being infected with HIV, it is advised that you take a certain medicine daily to prevent HIV infection. This is called pre-exposure prophylaxis (PrEP). You may be at risk if:  You are a man who has sex with other men (MSM).  You are attracted to the opposite sex (heterosexual) and are having sex with more than one partner.  You take drugs with a needle.  You have sex with someone who has HIV.  Talk with your doctor about if you are at high risk of being infected with HIV. If you begin to take PrEP, get  tested for HIV first. Get tested every 3 months for as long as you are taking PrEP.  Get tested for STDs every year if you are sexually active. If you are treated for an STD, get tested again 3 months after you are treated. WHAT SHOULD I DO IF I THINK I HAVE AN STD?  See your doctor.  Tell your sex partner(s) that you have an STD. They should be tested and treated.  Do not have sex until your doctor says it is okay. WHEN SHOULD I GET HELP? Get help right away if:  You have bad belly (abdominal) pain.  You are a man and have puffiness (swelling) or pain in your testicles.  You are a woman and have puffiness in your vagina.   This information is not intended to replace advice given to you by your health care provider. Make sure you discuss any questions you have with your health care provider.   Document Released: 04/15/2004 Document Revised: 03/29/2014 Document Reviewed: 09/01/2012 Elsevier Interactive Patient Education [employer]

## 2015-08-31 NOTE — ED Notes (Signed)
Patient here with 1 week of vaginal discharge

## 2015-08-31 NOTE — ED Notes (Signed)
Pt ambulates independently and with steady gait at time of discharge. Discharge instructions and follow up information reviewed with patient. No other questions or concerns voiced at this time.  

## 2015-09-01 LAB — GC/CHLAMYDIA PROBE AMP (~~LOC~~) NOT AT ARMC
CHLAMYDIA, DNA PROBE: POSITIVE — AB
Neisseria Gonorrhea: NEGATIVE

## 2015-09-02 ENCOUNTER — Telehealth (HOSPITAL_BASED_OUTPATIENT_CLINIC_OR_DEPARTMENT_OTHER): Payer: Self-pay | Admitting: Emergency Medicine

## 2017-10-06 ENCOUNTER — Emergency Department (HOSPITAL_COMMUNITY)
Admission: EM | Admit: 2017-10-06 | Discharge: 2017-10-06 | Disposition: A | Discharge status: Home / Self Care | Payer: Self-pay | Attending: Emergency Medicine | Admitting: Emergency Medicine

## 2017-10-06 ENCOUNTER — Encounter (HOSPITAL_COMMUNITY): Payer: Self-pay | Admitting: Emergency Medicine

## 2017-10-06 ENCOUNTER — Other Ambulatory Visit: Payer: Self-pay

## 2017-10-06 DIAGNOSIS — B9689 Other specified bacterial agents as the cause of diseases classified elsewhere: Secondary | ICD-10-CM

## 2017-10-06 DIAGNOSIS — N760 Acute vaginitis: Secondary | ICD-10-CM | POA: Insufficient documentation

## 2017-10-06 DIAGNOSIS — F17210 Nicotine dependence, cigarettes, uncomplicated: Secondary | ICD-10-CM | POA: Insufficient documentation

## 2017-10-06 LAB — URINALYSIS, ROUTINE W REFLEX MICROSCOPIC
BILIRUBIN URINE: NEGATIVE
Bacteria, UA: NONE SEEN
GLUCOSE, UA: NEGATIVE mg/dL
HGB URINE DIPSTICK: NEGATIVE
Ketones, ur: NEGATIVE mg/dL
NITRITE: NEGATIVE
PH: 6 (ref 5.0–8.0)
Protein, ur: NEGATIVE mg/dL
SPECIFIC GRAVITY, URINE: 1.012 (ref 1.005–1.030)

## 2017-10-06 LAB — WET PREP, GENITAL
SPERM: NONE SEEN
Trich, Wet Prep: NONE SEEN
Yeast Wet Prep HPF POC: NONE SEEN

## 2017-10-06 MED ORDER — METRONIDAZOLE 500 MG PO TABS: 500.0000 mg | ORAL_TABLET | Freq: Two times a day (BID) | ORAL | 0 refills | Status: DC

## 2017-10-06 MED ORDER — FLUCONAZOLE 100 MG PO TABS
200.0000 mg | ORAL_TABLET | Freq: Once | ORAL | Status: AC
  Administered 2017-10-06: 200 mg via ORAL
  Filled 2017-10-06: qty 2

## 2017-10-06 NOTE — ED Triage Notes (Signed)
Pt reports she is here to get checked because her boyfriends privates are hurting. Pt reports odor to urine and dysuria that started 3 days ago. LMP 09/07/17. Denies any hematuria or vaginal discharge.

## 2017-10-06 NOTE — ED Provider Notes (Signed)
[facility] Provider Note   CSN: [id number] Arrival date & time: 10/06/17  1640     History   Chief Complaint Chief Complaint  Patient presents with  . Dysuria    HPI Tina Knox is a 27 y.o. female who presents to the ED for STD check. Patient reports vaginal itching that started 3 days ago and states that her boyfriend is having symptoms as well.   HPI  Past Medical History:  Diagnosis Date  . Anemia    with last pregnancy  . No pertinent past medical history   . Trichomonas     Patient Active Problem List   Diagnosis Date Noted  . Marijuana smoker 01/26/2011  . Anemia 01/12/2011  . Insufficient prenatal care 01/12/2011  . Trichomoniasis of vagina 01/12/2011    Past Surgical History:  Procedure Laterality Date  . BILATERAL SALPINGECTOMY  03/02/2012   Procedure: BILATERAL SALPINGECTOMY;  Surgeon: Allie BossierMyra C Dove, MD;  Location: [facility];  Service: Gynecology;  Laterality: Bilateral;  . INDUCED ABORTION    . NO PAST SURGERIES       OB History    Gravida  4   Para  3   Term  3   Preterm      AB  1   Living  3     SAB      TAB  1   Ectopic      Multiple      Live Births  3            Home Medications    Prior to Admission medications   Medication Sig Start Date End Date Taking? Authorizing Provider  benzonatate (TESSALON) 100 MG capsule Take 1 capsule (100 mg total) by mouth 3 (three) times daily as needed for cough. 07/18/15   Antony MaduraHumes, Kelly, PA-C  ibuprofen (ADVIL,MOTRIN) 600 MG tablet Take 1 tablet (600 mg total) by mouth every 6 (six) hours as needed for headache, mild pain or moderate pain (or body aches). 07/18/15   Antony MaduraHumes, Kelly, PA-C  metroNIDAZOLE (FLAGYL) 500 MG tablet Take 1 tablet (500 mg total) by mouth 2 (two) times daily. 10/06/17   Janne NapoleonNeese, Hope M, NP  sodium chloride (OCEAN) 0.65 % SOLN nasal spray Place 1 spray into both nostrils as needed. 07/18/15   Antony MaduraHumes, Kelly, PA-C    Family  History Family History  Problem Relation Age of Onset  . Other Neg Hx     Social History Social History   Tobacco Use  . Smoking status: Current Every Day Smoker    Packs/day: 0.25    Types: Cigarettes  . Smokeless tobacco: Former NeurosurgeonUser    Quit date: 11/05/2011  Substance Use Topics  . Alcohol use: No  . Drug use: No     Allergies   Patient has no known allergies.   Review of Systems Review of Systems  Genitourinary: Positive for frequency and vaginal discharge. Negative for pelvic pain and urgency. Vaginal pain: itching.  All other systems reviewed and are negative.    Physical Exam Updated Vital Signs BP 116/69 (BP Location: Right Arm)   Pulse 86   Temp 99.1 F (37.3 C) (Oral)   Ht 5' 2.5" (1.588 m)   Wt 64.4 kg (142 lb)   LMP 09/07/2017   SpO2 100%   BMI 25.56 kg/m   Physical Exam  Constitutional: She appears well-developed and well-nourished. No distress.  HENT:  Head: Normocephalic.  Eyes: EOM are normal.  Neck: Neck  supple.  Cardiovascular: Normal rate.  Pulmonary/Chest: Effort normal.  Abdominal: Soft. There is no tenderness.  Genitourinary:  Genitourinary Comments: External genitalia without lesions, mucous d/c vaginal vault, no CMT, no adnexal tenderness or mass palpated, uterus not enlarged.   Musculoskeletal: Normal range of motion.  Neurological: She is alert.  Skin: Skin is warm and dry.  Psychiatric: She has a normal mood and affect. Her behavior is normal.  Nursing note and vitals reviewed.    ED Treatments / Results  Labs (all labs ordered are listed, but only abnormal results are displayed) Labs Reviewed  WET PREP, GENITAL - Abnormal; Notable for the following components:      Result Value   Clue Cells Wet Prep HPF POC PRESENT (*)    WBC, Wet Prep HPF POC FEW (*)    All other components within normal limits  URINALYSIS, ROUTINE W REFLEX MICROSCOPIC - Abnormal; Notable for the following components:   Leukocytes, UA TRACE (*)     All other components within normal limits  GC/CHLAMYDIA PROBE AMP ([place]) NOT AT [facility]    Radiology No results found.  Procedures Procedures (including critical care time)  Medications Ordered in ED Medications  fluconazole (DIFLUCAN) tablet 200 mg (has no administration in time range)     Initial Impression / Assessment and Plan / ED Course  I have reviewed the triage vital signs and the nursing notes. Pt presents with concerns for possible STD.  Pt understands that they have GC/Chlamydia cultures pending and that they will need to inform all sexual partners if results return positive.  Pt not concerning for PID because hemodynamically stable and no cervical motion tenderness on pelvic exam. Pt has been treated with Flagyl for Bacterial Vaginosis. Pt has been advised to not drink alcohol while on this medication.  Patient to be discharged with instructions to follow up with [facility]. Discussed importance of using protection when sexually active. Offered HIV and RPR screening but patient declined.  Final Clinical Impressions(s) / ED Diagnoses   Final diagnoses:  Bacterial vaginosis    ED Discharge Orders        Ordered    metroNIDAZOLE (FLAGYL) 500 MG tablet  2 times daily     10/06/17 1927       Kerrie Buffalo [place], [place] 10/06/17 1934    Eber Hong, MD 10/11/17 2004

## 2017-10-06 NOTE — Discharge Instructions (Addendum)
Do not drink alcohol while taking the medication as it will make you very sick if you do. Follow up with the Health Department for additional STD screening.

## 2017-10-06 NOTE — ED Notes (Signed)
Pelvic exam done by Hope - NP and Madiline Saffran - EMT assisted. 

## 2017-10-06 NOTE — ED Notes (Signed)
Pt up for discharge VS documented

## 2017-10-07 LAB — GC/CHLAMYDIA PROBE AMP (~~LOC~~) NOT AT ARMC
CHLAMYDIA, DNA PROBE: POSITIVE — AB
Neisseria Gonorrhea: POSITIVE — AB

## 2017-10-08 ENCOUNTER — Encounter (HOSPITAL_COMMUNITY): Payer: Self-pay

## 2017-10-08 ENCOUNTER — Emergency Department (HOSPITAL_COMMUNITY)
Admission: EM | Admit: 2017-10-08 | Discharge: 2017-10-08 | Disposition: A | Discharge status: Home / Self Care | Payer: Self-pay | Attending: Emergency Medicine | Admitting: Emergency Medicine

## 2017-10-08 DIAGNOSIS — Z79899 Other long term (current) drug therapy: Secondary | ICD-10-CM | POA: Insufficient documentation

## 2017-10-08 DIAGNOSIS — F17210 Nicotine dependence, cigarettes, uncomplicated: Secondary | ICD-10-CM | POA: Insufficient documentation

## 2017-10-08 DIAGNOSIS — A749 Chlamydial infection, unspecified: Secondary | ICD-10-CM | POA: Insufficient documentation

## 2017-10-08 DIAGNOSIS — A549 Gonococcal infection, unspecified: Secondary | ICD-10-CM | POA: Insufficient documentation

## 2017-10-08 MED ORDER — CEFTRIAXONE SODIUM 250 MG IJ SOLR
250.0000 mg | Freq: Once | INTRAMUSCULAR | Status: AC
  Administered 2017-10-08: 250 mg via INTRAMUSCULAR
  Filled 2017-10-08: qty 250

## 2017-10-08 MED ORDER — AZITHROMYCIN 250 MG PO TABS
1000.0000 mg | ORAL_TABLET | Freq: Once | ORAL | Status: AC
  Administered 2017-10-08: 1000 mg via ORAL
  Filled 2017-10-08: qty 4

## 2017-10-08 NOTE — Discharge Instructions (Signed)
You were treated for chlamydia and gonorrhea today.  Please refrain from sexual activity for the next 7 days.  Inform all sexual partners so that they can be treated.  I have listed the information below to the health department, please go there for any future STD concerns.  Return to the ER if you have any new or concerning symptoms like fever, abdominal pain, nausea/vomiting, pelvic pain.

## 2017-10-08 NOTE — ED Triage Notes (Signed)
Pt presents for treatment of GC/Chlamydia. Pt reports was tested two days ago.

## 2017-10-08 NOTE — ED Provider Notes (Signed)
[facility]   CSN: [id number] Arrival date & time: 10/08/17  1344     History   Chief Complaint Chief Complaint  Patient presents with  . std treatment    HPI Tina Knox is a 27 y.o. female.  HPI   Tina Knox is a 27yo female with a history of marijuana use that presents to the emergency department for GC/chlamydia treatment.  Patient was seen in the ED 2 days ago for vaginal irritation and was found to have clue cells on her wet prep.  She was treated for bacterial vaginosis with Flagyl.  She states that she received a phone call today telling her that she also tested positive for chlamydia and gonorrhea and would like to be treated.  She denies any symptoms currently.  Denies fever, chills, abdominal pain, vomiting, dysuria, urinary frequency, vaginal discharge, pelvic pain.  Reports she is sexually active with one female partner, denies regular condom use.  He was recently treated for GC/chlamydia as well.  Past Medical History:  Diagnosis Date  . Anemia    with last pregnancy  . No pertinent past medical history   . Trichomonas     Patient Active Problem List   Diagnosis Date Noted  . Marijuana smoker 01/26/2011  . Anemia 01/12/2011  . Insufficient prenatal care 01/12/2011  . Trichomoniasis of vagina 01/12/2011    Past Surgical History:  Procedure Laterality Date  . BILATERAL SALPINGECTOMY  03/02/2012   Procedure: BILATERAL SALPINGECTOMY;  Surgeon: Allie BossierMyra C Dove, MD;  Location: [facility];  Service: Gynecology;  Laterality: Bilateral;  . INDUCED ABORTION    . NO PAST SURGERIES       OB History    Gravida  4   Para  3   Term  3   Preterm      AB  1   Living  3     SAB      TAB  1   Ectopic      Multiple      Live Births  3            Home Medications    Prior to Admission medications   Medication Sig Start Date End Date Taking? Authorizing Provider  benzonatate (TESSALON) 100 MG  capsule Take 1 capsule (100 mg total) by mouth 3 (three) times daily as needed for cough. 07/18/15   Antony MaduraHumes, Kelly, PA-C  ibuprofen (ADVIL,MOTRIN) 600 MG tablet Take 1 tablet (600 mg total) by mouth every 6 (six) hours as needed for headache, mild pain or moderate pain (or body aches). 07/18/15   Antony MaduraHumes, Kelly, PA-C  metroNIDAZOLE (FLAGYL) 500 MG tablet Take 1 tablet (500 mg total) by mouth 2 (two) times daily. 10/06/17   Janne NapoleonNeese, Hope M, NP  sodium chloride (OCEAN) 0.65 % SOLN nasal spray Place 1 spray into both nostrils as needed. 07/18/15   Antony MaduraHumes, Kelly, PA-C    Family History Family History  Problem Relation Age of Onset  . Other Neg Hx     Social History Social History   Tobacco Use  . Smoking status: Current Every Day Smoker    Packs/day: 0.25    Types: Cigarettes  . Smokeless tobacco: Former NeurosurgeonUser    Quit date: 11/05/2011  Substance Use Topics  . Alcohol use: No  . Drug use: No     Allergies   Patient has no known allergies.   Review of Systems Review of Systems  Constitutional: Negative for chills  and fever.  Gastrointestinal: Negative for abdominal pain, nausea and vomiting.  Genitourinary: Negative for difficulty urinating, dysuria, flank pain, frequency, genital sores, menstrual problem, pelvic pain, vaginal bleeding, vaginal discharge and vaginal pain.  Skin: Negative for color change.     Physical Exam Updated Vital Signs BP 101/66 (BP Location: Right Arm)   Pulse 65   Temp 99.2 F (37.3 C) (Oral)   Resp 16   SpO2 100%   Physical Exam  Constitutional: She is oriented to person, place, and time. She appears well-developed and well-nourished. No distress.  Sitting at bedside in no apparent distress, nontoxic-appearing.  HENT:  Head: Normocephalic and atraumatic.  Eyes: Right eye exhibits no discharge. Left eye exhibits no discharge.  Pulmonary/Chest: Effort normal. No respiratory distress.  Abdominal: Soft. Bowel sounds are normal. There is no tenderness.  There is no guarding.  Musculoskeletal: Normal range of motion.  Neurological: She is alert and oriented to person, place, and time. Coordination normal.  Skin: Skin is warm and dry. Capillary refill takes less than 2 seconds. She is not diaphoretic.  Psychiatric: She has a normal mood and affect. Her behavior is normal.  Nursing note and vitals reviewed.    ED Treatments / Results  Labs (all labs ordered are listed, but only abnormal results are displayed) Labs Reviewed - No data to display  EKG None  Radiology No results found.  Procedures Procedures (including critical care time)  Medications Ordered in ED Medications  azithromycin (ZITHROMAX) tablet 1,000 mg (has no administration in time range)  cefTRIAXone (ROCEPHIN) injection 250 mg (has no administration in time range)     Initial Impression / Assessment and Plan / ED Course  I have reviewed the triage vital signs and the nursing notes.  Pertinent labs & imaging results that were available during my care of the patient were reviewed by me and considered in my medical decision making (see chart for details).    Presents to the emergency department for GC/chlamydia treatment.  Patient was seen in the ED 2 days with vaginal irritation.  According to chart review, pelvic exam at that time without CMT, adnexal tenderness or vaginal discharge.  Patient had clue cells on wet prep and was treated for bv.    Patient denies any symptoms currently.  No abdominal pain, fever, vomiting, pelvic pain.  Do not suspect PID given patient afebrile, denies symptoms and had benign pelvic exam 2 days ago.  Will defer pelvic exam at this time.  According to records, she was not tested for HIV/syphilis and I have offered her this testing today.  She declines.  She was treated with Rocephin and Zithromax.  Counseled her on return precautions and have given her the information to the health department for further STD concerns.  She  agrees.  Final Clinical Impressions(s) / ED Diagnoses   Final diagnoses:  Chlamydia  Gonorrhea    ED Discharge Orders    None       Lawrence Marseilles 10/08/17 1557    Jacalyn Lefevre, MD 10/08/17 [phone]

## 2018-06-29 ENCOUNTER — Emergency Department (HOSPITAL_COMMUNITY)
Admission: EM | Admit: 2018-06-29 | Discharge: 2018-06-29 | Disposition: A | Discharge status: Home / Self Care | Payer: Self-pay | Attending: Emergency Medicine | Admitting: Emergency Medicine

## 2018-06-29 ENCOUNTER — Encounter (HOSPITAL_COMMUNITY): Payer: Self-pay | Admitting: Emergency Medicine

## 2018-06-29 ENCOUNTER — Other Ambulatory Visit: Payer: Self-pay

## 2018-06-29 DIAGNOSIS — Z79899 Other long term (current) drug therapy: Secondary | ICD-10-CM | POA: Insufficient documentation

## 2018-06-29 DIAGNOSIS — B9689 Other specified bacterial agents as the cause of diseases classified elsewhere: Secondary | ICD-10-CM

## 2018-06-29 DIAGNOSIS — N760 Acute vaginitis: Secondary | ICD-10-CM | POA: Insufficient documentation

## 2018-06-29 DIAGNOSIS — F17210 Nicotine dependence, cigarettes, uncomplicated: Secondary | ICD-10-CM | POA: Insufficient documentation

## 2018-06-29 LAB — URINALYSIS, ROUTINE W REFLEX MICROSCOPIC
Bilirubin Urine: NEGATIVE
Glucose, UA: NEGATIVE mg/dL
Hgb urine dipstick: NEGATIVE
Ketones, ur: NEGATIVE mg/dL
Nitrite: NEGATIVE
Protein, ur: NEGATIVE mg/dL
Specific Gravity, Urine: 1.021 (ref 1.005–1.030)
pH: 6 (ref 5.0–8.0)

## 2018-06-29 LAB — WET PREP, GENITAL
Sperm: NONE SEEN
Trich, Wet Prep: NONE SEEN
Yeast Wet Prep HPF POC: NONE SEEN

## 2018-06-29 LAB — GC/CHLAMYDIA PROBE AMP (~~LOC~~) NOT AT ARMC
Chlamydia: NEGATIVE
Neisseria Gonorrhea: NEGATIVE

## 2018-06-29 LAB — PREGNANCY, URINE: Preg Test, Ur: NEGATIVE

## 2018-06-29 MED ORDER — METRONIDAZOLE 500 MG PO TABS: 500.0000 mg | ORAL_TABLET | Freq: Two times a day (BID) | ORAL | 0 refills | Status: AC

## 2018-06-29 MED ORDER — METRONIDAZOLE 500 MG PO TABS
500.0000 mg | ORAL_TABLET | Freq: Once | ORAL | Status: AC
  Administered 2018-06-29: 05:00:00 500 mg via ORAL
  Filled 2018-06-29: qty 1

## 2018-06-29 NOTE — ED Triage Notes (Signed)
Reports vaginal pain x 2 days.  Reports discharge and itching without odor.  Reports sexually active with 1 partner.

## 2018-06-29 NOTE — ED Provider Notes (Signed)
[facility] Provider [facility]  CSN: [id number] Arrival date & time: 06/29/18 0256  Chief Complaint(s) Vaginal Itching  HPI Tina Knox is a 28 y.o. female with a past medical history of sexual transmitted disease who presents to the emergency department with 2 days of vaginal itching and white discharge.  Patient reports being sexually active with same partner who also had chlamydia in trichomonas last year.  She reports that they were both treated but did not have any test of cure.  Patient denies any vaginal pain or bleeding.  Last menstrual cycle was March 18.  Denies any fevers or chills.  No abdominal pain.  No nausea or vomiting.  No dysuria.  HPI  Past Medical History Past Medical History:  Diagnosis Date  . Anemia    with last pregnancy  . No pertinent past medical history   . Trichomonas    Patient Active Problem List   Diagnosis Date Noted  . Marijuana smoker 01/26/2011  . Anemia 01/12/2011  . Insufficient prenatal care 01/12/2011  . Trichomoniasis of vagina 01/12/2011   Home Medication(s) Prior to Admission medications   Medication Sig Start Date End Date Taking? Authorizing Provider  benzonatate (TESSALON) 100 MG capsule Take 1 capsule (100 mg total) by mouth 3 (three) times daily as needed for cough. 07/18/15   Antony MaduraHumes, Kelly, PA-C  ibuprofen (ADVIL,MOTRIN) 600 MG tablet Take 1 tablet (600 mg total) by mouth every 6 (six) hours as needed for headache, mild pain or moderate pain (or body aches). 07/18/15   Antony MaduraHumes, Kelly, PA-C  metroNIDAZOLE (FLAGYL) 500 MG tablet Take 1 tablet (500 mg total) by mouth 2 (two) times daily for 7 days. 06/29/18 07/06/18  Nira Connardama, Pedro Eduardo, MD  sodium chloride (OCEAN) 0.65 % SOLN nasal spray Place 1 spray into both nostrils as needed. 07/18/15   Antony MaduraHumes, Kelly, PA-C                                                                                                                                    Past Surgical  History Past Surgical History:  Procedure Laterality Date  . BILATERAL SALPINGECTOMY  03/02/2012   Procedure: BILATERAL SALPINGECTOMY;  Surgeon: Allie BossierMyra C Dove, MD;  Location: [facility];  Service: Gynecology;  Laterality: Bilateral;  . INDUCED ABORTION    . NO PAST SURGERIES     Family History Family History  Problem Relation Age of Onset  . Other Neg Hx     Social History Social History   Tobacco Use  . Smoking status: Current Every Day Smoker    Packs/day: 0.25    Types: Cigarettes  . Smokeless tobacco: Former NeurosurgeonUser    Quit date: 11/05/2011  Substance Use Topics  . Alcohol use: No  . Drug use: No   Allergies Patient has no known allergies.  Review of Systems Review of Systems All other systems are reviewed and are negative for acute  change except as noted in the HPI  Physical Exam Vital Signs  I have reviewed the triage vital signs BP 122/78 (BP Location: Right Arm)   Pulse 80   Temp 99.7 F (37.6 C) (Oral)   Resp 20   Ht 5' 2.5" (1.588 m)   Wt 63.5 kg   LMP 06/07/2018 (Approximate)   SpO2 100%   BMI 25.20 kg/m   Physical Exam Vitals signs reviewed. Exam conducted with a chaperone present.  Constitutional:      General: She is not in acute distress.    Appearance: She is well-developed. She is not diaphoretic.  HENT:     Head: Normocephalic and atraumatic.     Right Ear: External ear normal.     Left Ear: External ear normal.     Nose: Nose normal.  Eyes:     General: No scleral icterus.    Conjunctiva/sclera: Conjunctivae normal.  Neck:     Musculoskeletal: Normal range of motion.     Trachea: Phonation normal.  Cardiovascular:     Rate and Rhythm: Normal rate and regular rhythm.  Pulmonary:     Effort: Pulmonary effort is normal. No respiratory distress.     Breath sounds: No stridor.  Abdominal:     General: There is no distension.  Genitourinary:    Pubic Area: No rash.      Labia:        Right: No rash, tenderness or lesion.        Left: No  rash, tenderness or lesion.      Urethra: No urethral swelling.     Vagina: No signs of injury and foreign body. Vaginal discharge present. No erythema, tenderness, bleeding or lesions.     Cervix: No cervical motion tenderness, discharge, friability, lesion, erythema or cervical bleeding.     Uterus: Normal.      Adnexa: Right adnexa normal and left adnexa normal.  Musculoskeletal: Normal range of motion.  Neurological:     Mental Status: She is alert and oriented to person, place, and time.  Psychiatric:        Behavior: Behavior normal.     ED Results and Treatments Labs (all labs ordered are listed, but only abnormal results are displayed) Labs Reviewed  WET PREP, GENITAL - Abnormal; Notable for the following components:      Result Value   Clue Cells Wet Prep HPF POC PRESENT (*)    WBC, Wet Prep HPF POC MANY (*)    All other components within normal limits  URINALYSIS, ROUTINE W REFLEX MICROSCOPIC - Abnormal; Notable for the following components:   APPearance HAZY (*)    Leukocytes,Ua SMALL (*)    Bacteria, UA RARE (*)    All other components within normal limits  PREGNANCY, URINE  GC/CHLAMYDIA PROBE AMP ([place]) NOT AT [facility]  EKG  EKG Interpretation  Date/Time:    Ventricular Rate:    PR Interval:    QRS Duration:   QT Interval:    QTC Calculation:   R Axis:     Text Interpretation:        Radiology No results found. Pertinent labs & imaging results that were available during my care of the patient were reviewed by me and considered in my medical decision making (see chart for details).  Medications Ordered in ED Medications  metroNIDAZOLE (FLAGYL) tablet 500 mg (has no administration in time range)                                                                                                                                     Procedures Procedures  (including critical care time)  Medical Decision Making / ED Course I have reviewed the nursing notes for this encounter and the patient's prior records (if available in EHR or on provided paperwork).    Patient presents with vaginal itching and discharge.  Abdominal exam benign.  UPT negative.  UA not suspicious for urinary tract infection.  Pelvic exam with mild vaginal discharge without evidence of cervicitis or PID.  Wet prep positive for bacterial vaginosis, negative for yeast or trichomonas.  Given the lack of evidence of cervicitis or PID, will allow GC/chlamydia to result.   We will treat bacterial vaginosis with Flagyl.  The patient appears reasonably screened and/or stabilized for discharge and I doubt any other medical condition or other [facility] requiring further screening, evaluation, or treatment in the ED at this time prior to discharge.  The patient is safe for discharge with strict return precautions.   Final Clinical Impression(s) / ED Diagnoses Final diagnoses:  BV (bacterial vaginosis)    Disposition: Discharge  Condition: Good  I have discussed the results, Dx and Tx plan with the patient who expressed understanding and agree(s) with the plan. Discharge instructions discussed at great length. The patient was given strict return precautions who verbalized understanding of the instructions. No further questions at time of discharge.    ED Discharge Orders         Ordered    metroNIDAZOLE (FLAGYL) 500 MG tablet  2 times daily     06/29/18 0426           Follow Up: Primary care provider  Schedule an appointment as soon as possible for a visit  If you do not have a primary care physician, contact [facility] at [phone] for referral     This chart was dictated using voice recognition software.  Despite best efforts to proofread,  errors can occur which can change the documentation meaning.   Nira Conn, MD  06/29/18 [phone]

## 2019-03-14 ENCOUNTER — Emergency Department (HOSPITAL_COMMUNITY)
Admission: EM | Admit: 2019-03-14 | Discharge: 2019-03-14 | Disposition: A | Discharge status: Home / Self Care | Payer: Self-pay | Attending: Emergency Medicine | Admitting: Emergency Medicine

## 2019-03-14 ENCOUNTER — Encounter (HOSPITAL_COMMUNITY): Payer: Self-pay | Admitting: Emergency Medicine

## 2019-03-14 ENCOUNTER — Other Ambulatory Visit: Payer: Self-pay

## 2019-03-14 DIAGNOSIS — N760 Acute vaginitis: Secondary | ICD-10-CM | POA: Insufficient documentation

## 2019-03-14 DIAGNOSIS — Z202 Contact with and (suspected) exposure to infections with a predominantly sexual mode of transmission: Secondary | ICD-10-CM | POA: Insufficient documentation

## 2019-03-14 DIAGNOSIS — F17210 Nicotine dependence, cigarettes, uncomplicated: Secondary | ICD-10-CM | POA: Insufficient documentation

## 2019-03-14 DIAGNOSIS — B9689 Other specified bacterial agents as the cause of diseases classified elsewhere: Secondary | ICD-10-CM | POA: Insufficient documentation

## 2019-03-14 LAB — HIV ANTIBODY (ROUTINE TESTING W REFLEX): HIV Screen 4th Generation wRfx: NONREACTIVE

## 2019-03-14 LAB — URINALYSIS, ROUTINE W REFLEX MICROSCOPIC
Bilirubin Urine: NEGATIVE
Glucose, UA: NEGATIVE mg/dL
Hgb urine dipstick: NEGATIVE
Ketones, ur: NEGATIVE mg/dL
Leukocytes,Ua: NEGATIVE
Nitrite: NEGATIVE
Protein, ur: NEGATIVE mg/dL
Specific Gravity, Urine: 1.02 (ref 1.005–1.030)
pH: 7 (ref 5.0–8.0)

## 2019-03-14 LAB — WET PREP, GENITAL
Sperm: NONE SEEN
Trich, Wet Prep: NONE SEEN
Yeast Wet Prep HPF POC: NONE SEEN

## 2019-03-14 LAB — I-STAT BETA HCG BLOOD, ED (MC, WL, AP ONLY): I-stat hCG, quantitative: <5 m[IU]/mL (ref <5)

## 2019-03-14 MED ORDER — CEFTRIAXONE SODIUM 250 MG IJ SOLR
250.0000 mg | Freq: Once | INTRAMUSCULAR | Status: AC
  Administered 2019-03-14: 250 mg via INTRAMUSCULAR
  Filled 2019-03-14: qty 250

## 2019-03-14 MED ORDER — AZITHROMYCIN 250 MG PO TABS
1000.0000 mg | ORAL_TABLET | Freq: Once | ORAL | Status: AC
  Administered 2019-03-14: 1000 mg via ORAL
  Filled 2019-03-14: qty 4

## 2019-03-14 MED ORDER — ONDANSETRON 4 MG PO TBDP
8.0000 mg | ORAL_TABLET | Freq: Once | ORAL | Status: AC
  Administered 2019-03-14: 8 mg via ORAL
  Filled 2019-03-14: qty 2

## 2019-03-14 MED ORDER — METRONIDAZOLE 500 MG PO TABS: 500.0000 mg | ORAL_TABLET | Freq: Two times a day (BID) | ORAL | 0 refills | Status: DC

## 2019-03-14 NOTE — Discharge Instructions (Signed)
1. Medications: -Prescription sent to your pharmacy for Flagyl. This is used to treat bacterial vaginosis. Please take as prescribed. Continue usual home medications.  2. Treatment: rest, drink plenty of fluids, use a condom with every sexual encounter  3. Follow Up: Please followup with your primary doctor in 3 days for discussion of your diagnoses and further evaluation after today's visit; if you do not have a primary care doctor use the resource guide provided to find one; Please return to the ER for worsening symptoms, high fevers or persistent vomiting.  You have been tested for HIV, syphilis, chlamydia and gonorrhea.  These results will be available in approximately 3 days.  Please inform all sexual partners if you test positive for any of these diseases.

## 2019-03-14 NOTE — ED Notes (Signed)
Received call from lab, wet prep opened in tranport to lab will need to be recollected.

## 2019-03-14 NOTE — ED Triage Notes (Signed)
Wants to be checked for STD states has  Vag d/c and odor

## 2019-03-14 NOTE — ED Provider Notes (Signed)
[facility] EMERGENCY DEPARTMENT Provider Note   CSN: [id number] Arrival date & time: 03/14/19  9937     History Chief Complaint  Patient presents with  . Exposure to STD    Tina Knox is a 28 y.o. female past medical history of anemia, STDs presents emergency department today with chief complaint of exposure to STD.  Patient states her partner was tested for STDs yesterday.  She was concerned that he was being tested and thought she should be evaluated as well.  She is endorsing 1 week of yellow vaginal discharge and intermittent abdominal cramping.  She states this feels like menstrual cramps.  She is not anything for symptoms prior to arrival. LMP was 3 weeks ago. She denies fever, chills, chest pain, flank pain, nausea, vomiting, gross hematuria, urinary frequency, dysuria, rash, genital lesions. History provided by patient with additional history obtained from chart review.     Past Medical History:  Diagnosis Date  . Anemia    with last pregnancy  . No pertinent past medical history   . Trichomonas     Patient Active Problem List   Diagnosis Date Noted  . Marijuana smoker 01/26/2011  . Anemia 01/12/2011  . Insufficient prenatal care 01/12/2011  . Trichomoniasis of vagina 01/12/2011    Past Surgical History:  Procedure Laterality Date  . BILATERAL SALPINGECTOMY  03/02/2012   Procedure: BILATERAL SALPINGECTOMY;  Surgeon: Emily Filbert, MD;  Location: [facility];  Service: Gynecology;  Laterality: Bilateral;  . INDUCED ABORTION    . NO PAST SURGERIES       OB History    Gravida  4   Para  3   Term  3   Preterm      AB  1   Living  3     SAB      TAB  1   Ectopic      Multiple      Live Births  3           Family History  Problem Relation Age of Onset  . Other Neg Hx     Social History   Tobacco Use  . Smoking status: Current Every Day Smoker    Packs/day: 0.25    Types: Cigarettes  . Smokeless tobacco: Former Systems developer      Quit date: 11/05/2011  Substance Use Topics  . Alcohol use: No  . Drug use: No    Home Medications Prior to Admission medications   Medication Sig Start Date End Date Taking? Authorizing Provider  benzonatate (TESSALON) 100 MG capsule Take 1 capsule (100 mg total) by mouth 3 (three) times daily as needed for cough. 07/18/15   Antonietta Breach, PA-C  ibuprofen (ADVIL,MOTRIN) 600 MG tablet Take 1 tablet (600 mg total) by mouth every 6 (six) hours as needed for headache, mild pain or moderate pain (or body aches). 07/18/15   Antonietta Breach, PA-C  metroNIDAZOLE (FLAGYL) 500 MG tablet Take 1 tablet (500 mg total) by mouth 2 (two) times daily. 03/14/19   Clyde Upshaw E, PA-C  sodium chloride (OCEAN) 0.65 % SOLN nasal spray Place 1 spray into both nostrils as needed. 07/18/15   Antonietta Breach, PA-C    Allergies    Patient has no known allergies.  Review of Systems   Review of Systems All other systems are reviewed and are negative for acute change except as noted in the HPI.  Physical Exam Updated Vital Signs BP 127/80   Pulse 80  Temp 98.3 F (36.8 C) (Oral)   Resp 16   SpO2 100%   Physical Exam Vitals and nursing note reviewed.  Constitutional:      General: She is not in acute distress.    Appearance: She is not ill-appearing.  HENT:     Head: Normocephalic and atraumatic.     Right Ear: Tympanic membrane and external ear normal.     Left Ear: Tympanic membrane and external ear normal.     Nose: Nose normal.     Mouth/Throat:     Mouth: Mucous membranes are moist.     Pharynx: Oropharynx is clear.  Eyes:     General: No scleral icterus.       Right eye: No discharge.        Left eye: No discharge.     Extraocular Movements: Extraocular movements intact.     Conjunctiva/sclera: Conjunctivae normal.     Pupils: Pupils are equal, round, and reactive to light.  Neck:     Vascular: No JVD.  Cardiovascular:     Rate and Rhythm: Regular rhythm. Tachycardia present.      Pulses: Normal pulses.          Radial pulses are 2+ on the right side and 2+ on the left side.     Heart sounds: Normal heart sounds.  Pulmonary:     Comments: Lungs clear to auscultation in all fields. Symmetric chest rise. No wheezing, rales, or rhonchi. Abdominal:     General: Bowel sounds are normal.     Palpations: Abdomen is soft.     Tenderness: There is no right CVA tenderness or left CVA tenderness.     Comments: Abdomen is soft, non-distended, and non-tender in all quadrants. No rigidity, no guarding. No peritoneal signs.  Genitourinary:    Comments: Normal external genitalia. No pain with speculum insertion. Closed cervical os with normal appearance - no rash or lesions. No significant discharge or bleeding noted from cervix or in vaginal vault. On bimanual examination no adnexal tenderness or cervical motion tenderness. Chaperone Michelle EMT present during exam.  Musculoskeletal:        General: Normal range of motion.     Cervical back: Normal range of motion.  Skin:    General: Skin is warm and dry.     Capillary Refill: Capillary refill takes less than 2 seconds.  Neurological:     Mental Status: She is oriented to person, place, and time.     GCS: GCS eye subscore is 4. GCS verbal subscore is 5. GCS motor subscore is 6.     Comments: Fluent speech, no facial droop.  Psychiatric:        Mood and Affect: Mood is anxious.        Behavior: Behavior normal.       ED Results / Procedures / Treatments   Labs (all labs ordered are listed, but only abnormal results are displayed) Labs Reviewed  WET PREP, GENITAL - Abnormal; Notable for the following components:      Result Value   Clue Cells Wet Prep HPF POC PRESENT (*)    WBC, Wet Prep HPF POC FEW (*)    All other components within normal limits  URINALYSIS, ROUTINE W REFLEX MICROSCOPIC  RPR  HIV ANTIBODY (ROUTINE TESTING W REFLEX)  I-STAT BETA HCG BLOOD, ED ([facility], [facility], [facility] ONLY)  GC/CHLAMYDIA PROBE AMP ([place])  [facility]    EKG None  Radiology No results found.  Procedures  Procedures (including critical care time)  Medications Ordered in ED Medications  cefTRIAXone (ROCEPHIN) injection 250 mg (has no administration in time range)  azithromycin (ZITHROMAX) tablet 1,000 mg (has no administration in time range)  ondansetron (ZOFRAN-ODT) disintegrating tablet 8 mg (has no administration in time range)    ED Course  I have reviewed the triage vital signs and the nursing notes.  Pertinent labs & imaging results that were available during my care of the patient were reviewed by me and considered in my medical decision making (see chart for details).  Clinical Course as of Mar 13 1140  Wed Mar 14, 2019  1114 UA without signs of infection  Urinalysis, Routine w reflex microscopic [[facility]]  1117 Negative pregnancy  I-Stat beta hCG blood, ED [[facility]]  1138 Clue cells and WBC present in wet prep  Wet prep, genital(!) [[facility]]    Clinical Course User Index [[facility]] Randy Whitener, Caroleen Hamman, PA-C   MDM Rules/Calculators/A&P                       28 yo female presents with concerns for possible STD. She is afebrile, normotensive. She was noted to be tachycardic to 120 in triage. I suspect anxiety causing elevated heart rate. During my exam heart rate was 80. She has no abdominal tenderness, doubt acute surgical abdomen. No CVA tenderness. Significant labs as documented above in ED course. Pt understands that they have GC/Chlamydia cultures pending and that they will need to inform all sexual partners if results return positive. Pt has been treated prophylacticly with azithromycin and rocephin due to pts history, pelvic exam, and wet prep with increased WBCs. Pt not concerning for PID because hemodynamically stable and no cervical motion tenderness on pelvic exam. Pt has also been treated with flagyl for Bacterial Vaginosis. Pt has been advised to not drink alcohol while on this medication. Patient to be discharged  with instructions to follow up with OBGYN/PCP. Discussed importance of using protection when sexually active. Patient given information for [facility] as she is not primary care doctor is interested in establishing care somewhere. Patient agrees with plan of care and has no further questions. She is stable to discharge home.   Portions of this note were generated with Scientist, clinical (histocompatibility and immunogenetics). Dictation errors may occur despite best attempts at proofreading.   Final Clinical Impression(s) / ED Diagnoses Final diagnoses:  BV (bacterial vaginosis)  Exposure to STD    Rx / DC Orders ED Discharge Orders         Ordered    metroNIDAZOLE (FLAGYL) 500 MG tablet  2 times daily     03/14/19 1137           Kathyrn Lass 03/14/19 1142    Charlynne Pander, MD 03/14/19 1520

## 2019-03-15 LAB — GC/CHLAMYDIA PROBE AMP (~~LOC~~) NOT AT ARMC
Chlamydia: POSITIVE — AB
Neisseria Gonorrhea: NEGATIVE

## 2019-03-15 LAB — RPR: RPR Ser Ql: NONREACTIVE

## 2019-04-27 ENCOUNTER — Encounter (HOSPITAL_COMMUNITY): Payer: Self-pay | Admitting: *Deleted

## 2019-04-27 ENCOUNTER — Emergency Department (HOSPITAL_COMMUNITY)
Admission: EM | Admit: 2019-04-27 | Discharge: 2019-04-27 | Disposition: A | Discharge status: Home / Self Care | Payer: Self-pay | Attending: Emergency Medicine | Admitting: Emergency Medicine

## 2019-04-27 ENCOUNTER — Other Ambulatory Visit: Payer: Self-pay

## 2019-04-27 DIAGNOSIS — Z20822 Contact with and (suspected) exposure to covid-19: Secondary | ICD-10-CM | POA: Insufficient documentation

## 2019-04-27 DIAGNOSIS — Z5321 Procedure and treatment not carried out due to patient leaving prior to being seen by health care provider: Secondary | ICD-10-CM | POA: Insufficient documentation

## 2019-04-27 NOTE — ED Notes (Signed)
No answer for ready room x3.  

## 2019-04-27 NOTE — ED Triage Notes (Signed)
Patient states she was exposed to someone with covid  Last pm

## 2019-04-28 ENCOUNTER — Emergency Department (HOSPITAL_COMMUNITY)
Admission: EM | Admit: 2019-04-28 | Discharge: 2019-04-28 | Disposition: A | Discharge status: Home / Self Care | Payer: Self-pay | Attending: Emergency Medicine | Admitting: Emergency Medicine

## 2019-04-28 ENCOUNTER — Encounter (HOSPITAL_COMMUNITY): Payer: Self-pay

## 2019-04-28 ENCOUNTER — Emergency Department (HOSPITAL_COMMUNITY): Payer: Self-pay

## 2019-04-28 ENCOUNTER — Other Ambulatory Visit: Payer: Self-pay

## 2019-04-28 DIAGNOSIS — M25562 Pain in left knee: Secondary | ICD-10-CM | POA: Insufficient documentation

## 2019-04-28 DIAGNOSIS — W19XXXA Unspecified fall, initial encounter: Secondary | ICD-10-CM

## 2019-04-28 DIAGNOSIS — F17210 Nicotine dependence, cigarettes, uncomplicated: Secondary | ICD-10-CM | POA: Insufficient documentation

## 2019-04-28 DIAGNOSIS — Y999 Unspecified external cause status: Secondary | ICD-10-CM | POA: Insufficient documentation

## 2019-04-28 DIAGNOSIS — Y92410 Unspecified street and highway as the place of occurrence of the external cause: Secondary | ICD-10-CM | POA: Insufficient documentation

## 2019-04-28 DIAGNOSIS — Z79899 Other long term (current) drug therapy: Secondary | ICD-10-CM | POA: Insufficient documentation

## 2019-04-28 DIAGNOSIS — Y93I9 Activity, other involving external motion: Secondary | ICD-10-CM | POA: Insufficient documentation

## 2019-04-28 MED ORDER — IBUPROFEN 400 MG PO TABS
600.0000 mg | ORAL_TABLET | Freq: Once | ORAL | Status: AC
  Administered 2019-04-28: 15:00:00 600 mg via ORAL
  Filled 2019-04-28: qty 1

## 2019-04-28 MED ORDER — NAPROXEN 500 MG PO TABS: 500.0000 mg | ORAL_TABLET | Freq: Two times a day (BID) | ORAL | 0 refills | Status: DC
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
## 2019-04-28 NOTE — Discharge Instructions (Signed)
Take naproxen 2 times a day with meals.  Do not take other anti-inflammatories at the same time (Advil, Motrin, ibuprofen, Aleve). You may supplement with Tylenol if you need further pain control. Use ice packs, bedtime, to help with pain and swelling. Use crutches as needed for pain control.  If they are not helping or you do not want to use them, you do not have to. Follow-up with orthopedic doctor listed below in 1 week if your symptoms are not improving. Return to the emergency room with any new, sudden, concerning symptoms.

## 2019-08-03 ENCOUNTER — Encounter (HOSPITAL_COMMUNITY): Payer: Self-pay | Admitting: Emergency Medicine

## 2019-08-03 ENCOUNTER — Emergency Department (HOSPITAL_COMMUNITY)
Admission: EM | Admit: 2019-08-03 | Discharge: 2019-08-03 | Disposition: A | Discharge status: Home / Self Care | Payer: Self-pay | Attending: Emergency Medicine | Admitting: Emergency Medicine

## 2019-08-03 ENCOUNTER — Emergency Department (HOSPITAL_COMMUNITY): Payer: Self-pay

## 2019-08-03 ENCOUNTER — Other Ambulatory Visit: Payer: Self-pay

## 2019-08-03 DIAGNOSIS — R0981 Nasal congestion: Secondary | ICD-10-CM | POA: Insufficient documentation

## 2019-08-03 DIAGNOSIS — M7918 Myalgia, other site: Secondary | ICD-10-CM | POA: Insufficient documentation

## 2019-08-03 DIAGNOSIS — F17210 Nicotine dependence, cigarettes, uncomplicated: Secondary | ICD-10-CM | POA: Insufficient documentation

## 2019-08-03 DIAGNOSIS — R0789 Other chest pain: Secondary | ICD-10-CM | POA: Insufficient documentation

## 2019-08-03 DIAGNOSIS — Z20822 Contact with and (suspected) exposure to covid-19: Secondary | ICD-10-CM | POA: Insufficient documentation

## 2019-08-03 DIAGNOSIS — B9789 Other viral agents as the cause of diseases classified elsewhere: Secondary | ICD-10-CM | POA: Insufficient documentation

## 2019-08-03 DIAGNOSIS — R05 Cough: Secondary | ICD-10-CM | POA: Insufficient documentation

## 2019-08-03 DIAGNOSIS — J988 Other specified respiratory disorders: Secondary | ICD-10-CM | POA: Insufficient documentation

## 2019-08-03 LAB — CBC WITH DIFFERENTIAL/PLATELET
Abs Immature Granulocytes: 0.02 10*3/uL (ref 0.00–0.07)
Basophils Absolute: 0 10*3/uL (ref 0.0–0.1)
Basophils Relative: 1 %
Eosinophils Absolute: 0.1 10*3/uL (ref 0.0–0.5)
Eosinophils Relative: 1 %
HCT: 33 % — ABNORMAL LOW (ref 36.0–46.0)
Hemoglobin: 9.4 g/dL — ABNORMAL LOW (ref 12.0–15.0)
Immature Granulocytes: 0 %
Lymphocytes Relative: 21 %
Lymphs Abs: 1.3 10*3/uL (ref 0.7–4.0)
MCH: 20.9 pg — ABNORMAL LOW (ref 26.0–34.0)
MCHC: 28.5 g/dL — ABNORMAL LOW (ref 30.0–36.0)
MCV: 73.3 fL — ABNORMAL LOW (ref 80.0–100.0)
Monocytes Absolute: 0.7 10*3/uL (ref 0.1–1.0)
Monocytes Relative: 10 %
Neutro Abs: 4.2 10*3/uL (ref 1.7–7.7)
Neutrophils Relative %: 67 %
Platelets: 349 10*3/uL (ref 150–400)
RBC: 4.5 MIL/uL (ref 3.87–5.11)
RDW: 20 % — ABNORMAL HIGH (ref 11.5–15.5)
WBC: 6.3 10*3/uL (ref 4.0–10.5)
nRBC: 0 % (ref 0.0–0.2)

## 2019-08-03 LAB — URINALYSIS, ROUTINE W REFLEX MICROSCOPIC
Bilirubin Urine: NEGATIVE
Glucose, UA: NEGATIVE mg/dL
Hgb urine dipstick: NEGATIVE
Ketones, ur: NEGATIVE mg/dL
Leukocytes,Ua: NEGATIVE
Nitrite: NEGATIVE
Protein, ur: NEGATIVE mg/dL
Specific Gravity, Urine: 1.017 (ref 1.005–1.030)
pH: 6 (ref 5.0–8.0)

## 2019-08-03 LAB — COMPREHENSIVE METABOLIC PANEL
ALT: 17 U/L (ref 0–44)
AST: 28 U/L (ref 15–41)
Albumin: 3.9 g/dL (ref 3.5–5.0)
Alkaline Phosphatase: 42 U/L (ref 38–126)
Anion gap: 11 (ref 5–15)
BUN: <5 mg/dL — ABNORMAL LOW (ref 6–20)
CO2: 22 mmol/L (ref 22–32)
Calcium: 9.4 mg/dL (ref 8.9–10.3)
Chloride: 103 mmol/L (ref 98–111)
Creatinine, Ser: 0.68 mg/dL (ref 0.44–1.00)
GFR calc Af Amer: >60 mL/min (ref >60)
GFR calc non Af Amer: >60 mL/min (ref >60)
Glucose, Bld: 101 mg/dL — ABNORMAL HIGH (ref 70–99)
Potassium: 4 mmol/L (ref 3.5–5.1)
Sodium: 136 mmol/L (ref 135–145)
Total Bilirubin: 0.5 mg/dL (ref 0.3–1.2)
Total Protein: 7.7 g/dL (ref 6.5–8.1)

## 2019-08-03 LAB — I-STAT BETA HCG BLOOD, ED (MC, WL, AP ONLY): I-stat hCG, quantitative: <5 m[IU]/mL (ref <5)

## 2019-08-03 LAB — SARS CORONAVIRUS 2 BY RT PCR (HOSPITAL ORDER, PERFORMED IN ~~LOC~~ HOSPITAL LAB): SARS Coronavirus 2: NEGATIVE

## 2019-08-03 MED ORDER — LORATADINE 10 MG PO TABS: 10.0000 mg | ORAL_TABLET | Freq: Every day | ORAL | 0 refills | Status: DC

## 2019-08-03 MED ORDER — BENZONATATE 100 MG PO CAPS: 100.0000 mg | ORAL_CAPSULE | Freq: Three times a day (TID) | ORAL | 0 refills | Status: DC

## 2019-08-03 MED ORDER — BENZONATATE 100 MG PO CAPS
100.0000 mg | ORAL_CAPSULE | Freq: Once | ORAL | Status: AC
  Administered 2019-08-03: 100 mg via ORAL
  Filled 2019-08-03: qty 1

## 2019-08-03 NOTE — ED Notes (Signed)
Patient verbalizes understanding of discharge instructions. Opportunity for questioning and answers were provided. Armband removed by staff, pt discharged from ED to home via POV  

## 2019-08-03 NOTE — ED Triage Notes (Signed)
Patient c/o headache, congestion, and generalized body aches onset of Wednesday. Reports daughter came home from school with stuffy nose Monday.

## 2019-08-03 NOTE — Discharge Instructions (Signed)
Please read and follow all provided instructions.  Your diagnoses today include:  1. Viral respiratory infection    You appear to have an upper respiratory infection (URI). An upper respiratory tract infection, or cold, is a viral infection of the air passages leading to the lungs. It should improve gradually after 5-7 days. You may have a lingering cough that lasts for 2- 4 weeks after the infection.  Tests performed today include:  Vital signs. See below for your results today.   Blood counts and electrolytes  Chest x-ray -does not show any pneumonia  Covid test -is negative  Medications prescribed:   Tessalon Perles - cough suppressant medication  Take any prescribed medications only as directed. Treatment for your infection is aimed at treating the symptoms. There are no medications, such as antibiotics, that will cure your infection.   Home care instructions:  Follow any educational materials contained in this packet.   Your illness is contagious and can be spread to others, especially during the first 3 or 4 days. It cannot be cured by antibiotics or other medicines. Take basic precautions such as washing your hands often, covering your mouth when you cough or sneeze, and avoiding public places where you could spread your illness to others.   Please continue drinking plenty of fluids.  Use over-the-counter medicines as needed as directed on packaging for symptom relief.  You may also use ibuprofen or tylenol as directed on packaging for pain or fever.  Do not take multiple medicines containing Tylenol or acetaminophen to avoid taking too much of this medication.  Follow-up instructions: Please follow-up with your primary care provider in the next 3 days for further evaluation of your symptoms if you are not feeling better.   Return instructions:   Please return to the Emergency Department if you experience worsening symptoms.   RETURN IMMEDIATELY IF you develop shortness of  breath, confusion or altered mental status, a new rash, become dizzy, faint, or poorly responsive, or are unable to be cared for at home.  Please return if you have persistent vomiting and cannot keep down fluids or develop a fever that is not controlled by tylenol or motrin.    Please return if you have any other emergent concerns.  Additional Information:  Your vital signs today were: BP 99/73 (BP Location: Right Arm)   Pulse 84   Temp 99 F (37.2 C) (Oral)   Resp 18   Ht 5' 2.5" (1.588 m)   Wt 73.5 kg   LMP 07/14/2019   SpO2 100%   BMI 29.16 kg/m  If your blood pressure (BP) was elevated above 135/85 this visit, please have this repeated by your doctor within one month. --------------

## 2019-08-03 NOTE — ED Provider Notes (Signed)
[facility] EMERGENCY DEPARTMENT Provider Note   CSN: [id number] Arrival date & time: 08/03/19  0847     History No chief complaint on file.   Tina Knox is a 29 y.o. female.  Patient presents to the emergency department with 2-day history of headache, nasal congestion, cough, myalgias.  She was exposed to her daughter who had nasal congestion and runny nose several days preceding development of her own symptoms.  Patient feels generally fatigued and unwell.  She denies any nausea or vomiting.  Cough is productive of some sputum, no blood.  No known coronavirus contacts.  Patient has not been vaccinated against coronavirus.  She has some chest pain with coughing but no shortness of breath or persistent pain.  No abdominal pain or dysuria.  No diarrhea.  Patient denies skin rashes.  No change with taste or smell.        Past Medical History:  Diagnosis Date  . Anemia    with last pregnancy  . No pertinent past medical history   . Trichomonas     Patient Active Problem List   Diagnosis Date Noted  . Marijuana smoker 01/26/2011  . Anemia 01/12/2011  . Insufficient prenatal care 01/12/2011  . Trichomoniasis of vagina 01/12/2011    Past Surgical History:  Procedure Laterality Date  . BILATERAL SALPINGECTOMY  03/02/2012   Procedure: BILATERAL SALPINGECTOMY;  Surgeon: Allie Bossier, MD;  Location: [facility];  Service: Gynecology;  Laterality: Bilateral;  . INDUCED ABORTION    . NO PAST SURGERIES       OB History    Gravida  4   Para  3   Term  3   Preterm      AB  1   Living  3     SAB      TAB  1   Ectopic      Multiple      Live Births  3           Family History  Problem Relation Age of Onset  . Other Neg Hx     Social History   Tobacco Use  . Smoking status: Current Every Day Smoker    Packs/day: 0.25    Types: Cigarettes  . Smokeless tobacco: Former Neurosurgeon    Quit date: 11/05/2011  Substance Use Topics  . Alcohol use:  No  . Drug use: No    Home Medications Prior to Admission medications   Medication Sig Start Date End Date Taking? Authorizing Provider  benzonatate (TESSALON) 100 MG capsule Take 1 capsule (100 mg total) by mouth 3 (three) times daily as needed for cough. 07/18/15   Antony Madura, PA-C  ibuprofen (ADVIL,MOTRIN) 600 MG tablet Take 1 tablet (600 mg total) by mouth every 6 (six) hours as needed for headache, mild pain or moderate pain (or body aches). 07/18/15   Antony Madura, PA-C  metroNIDAZOLE (FLAGYL) 500 MG tablet Take 1 tablet (500 mg total) by mouth 2 (two) times daily. 03/14/19   Albrizze, Kaitlyn E, PA-C  naproxen (NAPROSYN) 500 MG tablet Take 1 tablet (500 mg total) by mouth 2 (two) times daily with a meal. 04/28/19   Caccavale, Sophia, PA-C  sodium chloride (OCEAN) 0.65 % SOLN nasal spray Place 1 spray into both nostrils as needed. 07/18/15   Antony Madura, PA-C    Allergies    Patient has no known allergies.  Review of Systems   Review of Systems  Constitutional: Positive for fatigue. Negative for  chills and fever.  HENT: Positive for congestion and rhinorrhea. Negative for ear pain, sinus pressure and sore throat.   Eyes: Negative for redness.  Respiratory: Positive for cough. Negative for shortness of breath and wheezing.   Gastrointestinal: Negative for abdominal pain, diarrhea, nausea and vomiting.  Genitourinary: Negative for dysuria.  Musculoskeletal: Positive for myalgias. Negative for neck stiffness.  Skin: Negative for rash.  Neurological: Negative for headaches.  Hematological: Negative for adenopathy.    Physical Exam Updated Vital Signs BP 99/73 (BP Location: Right Arm)   Pulse 84   Temp 99 F (37.2 C) (Oral)   Resp 18   Ht 5' 2.5" (1.588 m)   Wt 73.5 kg   LMP 07/14/2019   SpO2 100%   BMI 29.16 kg/m   Physical Exam Vitals and nursing note reviewed.  Constitutional:      Appearance: She is well-developed.  HENT:     Head: Normocephalic and atraumatic.      Jaw: No trismus.     Right Ear: Tympanic membrane, ear canal and external ear normal.     Left Ear: Tympanic membrane, ear canal and external ear normal.     Nose: Congestion and rhinorrhea present. No mucosal edema.     Mouth/Throat:     Mouth: Mucous membranes are not dry. No oral lesions.     Pharynx: Uvula midline. No oropharyngeal exudate, posterior oropharyngeal erythema or uvula swelling.     Tonsils: No tonsillar abscesses.  Eyes:     General:        Right eye: No discharge.        Left eye: No discharge.     Conjunctiva/sclera: Conjunctivae normal.  Cardiovascular:     Rate and Rhythm: Normal rate and regular rhythm.     Heart sounds: Normal heart sounds.  Pulmonary:     Effort: Pulmonary effort is normal. No respiratory distress.     Breath sounds: Normal breath sounds. No wheezing or rales.     Comments: Coughing during exam Abdominal:     Palpations: Abdomen is soft.     Tenderness: There is no abdominal tenderness.  Musculoskeletal:     Cervical back: Normal range of motion and neck supple.  Lymphadenopathy:     Cervical: No cervical adenopathy.  Skin:    General: Skin is warm and dry.  Neurological:     Mental Status: She is alert.     ED Results / Procedures / Treatments   Labs (all labs ordered are listed, but only abnormal results are displayed) Labs Reviewed  COMPREHENSIVE METABOLIC PANEL - Abnormal; Notable for the following components:      Result Value   Glucose, Bld 101 (*)    BUN <5 (*)    All other components within normal limits  CBC WITH DIFFERENTIAL/PLATELET - Abnormal; Notable for the following components:   Hemoglobin 9.4 (*)    HCT 33.0 (*)    MCV 73.3 (*)    MCH 20.9 (*)    MCHC 28.5 (*)    RDW 20.0 (*)    All other components within normal limits  SARS CORONAVIRUS 2 BY RT PCR (HOSPITAL ORDER, [facility])  URINALYSIS, ROUTINE W REFLEX MICROSCOPIC  I-STAT BETA HCG BLOOD, ED ([facility], [facility], [facility] ONLY)     EKG None  Radiology DG Chest 2 View  Result Date: 08/03/2019 CLINICAL DATA:  Cough and congestion with headache EXAM: CHEST - 2 VIEW COMPARISON:  July 18, 2015 FINDINGS: The lungs  are clear. Heart size and pulmonary vascularity are normal. No adenopathy. No bone lesions. IMPRESSION: No abnormality noted. Electronically Signed   By: Bretta Bang III M.D.   On: 08/03/2019 10:14    Procedures Procedures (including critical care time)  Medications Ordered in ED Medications  benzonatate (TESSALON) capsule 100 mg (has no administration in time range)    ED Course  I have reviewed the triage vital signs and the nursing notes.  Pertinent labs & imaging results that were available during my care of the patient were reviewed by me and considered in my medical decision making (see chart for details).  Patient seen and examined.  Lab work-up performed by nursing protocol is reassuring.  Discussed with patient at bedside.  Currently awaiting Covid testing.  Patient will need symptomatic treatment for her viral upper respiratory infection.  Vitals reviewed and did not show any signs of hypoxia or tachycardia.  Temperature 99 F.  Vital signs reviewed and are as follows: BP 99/73 (BP Location: Right Arm)   Pulse 84   Temp 99 F (37.2 C) (Oral)   Resp 18   Ht 5' 2.5" (1.588 m)   Wt 73.5 kg   LMP 07/14/2019   SpO2 100%   BMI 29.16 kg/m   1:15 PM COVID neg.   Patient counseled on supportive care for viral URI and s/s to return including worsening symptoms, persistent fever, persistent vomiting, or if they have any other concerns. Urged to see PCP if symptoms persist for more than 3 days. Patient verbalizes understanding and agrees with plan.     MDM Rules/Calculators/A&P                      Patient with symptoms consistent with a viral syndrome. COVID neg. Vitals are stable, no fever. No signs of dehydration. Lung exam normal, no signs of pneumonia. Supportive therapy  indicated with return if symptoms worsen.  Chronic anemia, stable.      Final Clinical Impression(s) / ED Diagnoses Final diagnoses:  Viral respiratory infection    Rx / DC Orders ED Discharge Orders         Ordered    benzonatate (TESSALON) 100 MG capsule  Every 8 hours     08/03/19 1311    loratadine (CLARITIN) 10 MG tablet  Daily     08/03/19 1311           Renne Crigler, PA-C 08/03/19 1316    Tegeler, Canary Brim, MD 08/03/19 [phone]

## 2019-08-11 ENCOUNTER — Other Ambulatory Visit: Payer: Self-pay

## 2019-08-11 ENCOUNTER — Emergency Department (HOSPITAL_COMMUNITY)
Admission: EM | Admit: 2019-08-11 | Discharge: 2019-08-11 | Disposition: A | Discharge status: Home / Self Care | Payer: Self-pay | Attending: Emergency Medicine | Admitting: Emergency Medicine

## 2019-08-11 ENCOUNTER — Encounter (HOSPITAL_COMMUNITY): Payer: Self-pay | Admitting: Emergency Medicine

## 2019-08-11 DIAGNOSIS — Z202 Contact with and (suspected) exposure to infections with a predominantly sexual mode of transmission: Secondary | ICD-10-CM | POA: Insufficient documentation

## 2019-08-11 DIAGNOSIS — F17210 Nicotine dependence, cigarettes, uncomplicated: Secondary | ICD-10-CM | POA: Insufficient documentation

## 2019-08-11 DIAGNOSIS — Z113 Encounter for screening for infections with a predominantly sexual mode of transmission: Secondary | ICD-10-CM

## 2019-08-11 DIAGNOSIS — Z79899 Other long term (current) drug therapy: Secondary | ICD-10-CM | POA: Insufficient documentation

## 2019-08-11 LAB — WET PREP, GENITAL
Sperm: NONE SEEN
Trich, Wet Prep: NONE SEEN
Yeast Wet Prep HPF POC: NONE SEEN

## 2019-08-11 LAB — URINALYSIS, ROUTINE W REFLEX MICROSCOPIC
Bilirubin Urine: NEGATIVE
Glucose, UA: NEGATIVE mg/dL
Hgb urine dipstick: NEGATIVE
Ketones, ur: NEGATIVE mg/dL
Leukocytes,Ua: NEGATIVE
Nitrite: NEGATIVE
Protein, ur: NEGATIVE mg/dL
Specific Gravity, Urine: 1.005 (ref 1.005–1.030)
pH: 7 (ref 5.0–8.0)

## 2019-08-11 LAB — I-STAT BETA HCG BLOOD, ED (MC, WL, AP ONLY): I-stat hCG, quantitative: <5 m[IU]/mL (ref <5)

## 2019-08-11 LAB — HIV ANTIBODY (ROUTINE TESTING W REFLEX): HIV Screen 4th Generation wRfx: NONREACTIVE

## 2019-08-11 NOTE — ED Provider Notes (Signed)
[facility] EMERGENCY DEPARTMENT Provider Note   CSN: [id number] Arrival date & time: 08/11/19  1027     History Chief Complaint  Patient presents with  . SEXUALLY TRANSMITTED DISEASE    Tina Knox is a 29 y.o. female who presents to the ED today with concern for exposure to STIs. Pt reports that 3-4 days ago her partner "joked" about having chlamydia. She states that he excused her of cheating on him and said he was diagnosed with chlamydia - she reports he then retracted his statement and laughed saying it was a joke. Pt brushed it off however reports they went to have intercourse this morning and her partner put on a condom which he usually never does causing her to be suspicious. She demanded that he take her to the ED immediately to be evaluated. Partner is denying having an STI currently. Pt without any complaints including vaginal discharge, pelvic pain, dysuria, urinary frequency, urgency, abdominal pain, nausea, vomiting, fevers, chills, or any other associated symptoms.   The history is provided by the patient and medical records.       Past Medical History:  Diagnosis Date  . Anemia    with last pregnancy  . No pertinent past medical history   . Trichomonas     Patient Active Problem List   Diagnosis Date Noted  . Marijuana smoker 01/26/2011  . Anemia 01/12/2011  . Insufficient prenatal care 01/12/2011  . Trichomoniasis of vagina 01/12/2011    Past Surgical History:  Procedure Laterality Date  . BILATERAL SALPINGECTOMY  03/02/2012   Procedure: BILATERAL SALPINGECTOMY;  Surgeon: Allie Bossier, MD;  Location: [facility];  Service: Gynecology;  Laterality: Bilateral;  . INDUCED ABORTION    . NO PAST SURGERIES       OB History    Gravida  4   Para  3   Term  3   Preterm      AB  1   Living  3     SAB      TAB  1   Ectopic      Multiple      Live Births  3           Family History  Problem Relation Age of Onset  . Other  Neg Hx     Social History   Tobacco Use  . Smoking status: Current Every Day Smoker    Packs/day: 0.25    Types: Cigarettes  . Smokeless tobacco: Former Neurosurgeon    Quit date: 11/05/2011  Substance Use Topics  . Alcohol use: No  . Drug use: No    Home Medications Prior to Admission medications   Medication Sig Start Date End Date Taking? Authorizing Provider  benzonatate (TESSALON) 100 MG capsule Take 1 capsule (100 mg total) by mouth every 8 (eight) hours. 08/03/19   Renne Crigler, PA-C  ibuprofen (ADVIL,MOTRIN) 600 MG tablet Take 1 tablet (600 mg total) by mouth every 6 (six) hours as needed for headache, mild pain or moderate pain (or body aches). 07/18/15   Antony Madura, PA-C  loratadine (CLARITIN) 10 MG tablet Take 1 tablet (10 mg total) by mouth daily. 08/03/19   Renne Crigler, PA-C  metroNIDAZOLE (FLAGYL) 500 MG tablet Take 1 tablet (500 mg total) by mouth 2 (two) times daily. 03/14/19   Albrizze, Kaitlyn E, PA-C  naproxen (NAPROSYN) 500 MG tablet Take 1 tablet (500 mg total) by mouth 2 (two) times daily with a meal. 04/28/19  Caccavale, Sophia, PA-C  sodium chloride (OCEAN) 0.65 % SOLN nasal spray Place 1 spray into both nostrils as needed. 07/18/15   Antony Madura, PA-C    Allergies    Patient has no known allergies.  Review of Systems   Review of Systems  Constitutional: Negative for chills and fever.  Gastrointestinal: Negative for abdominal pain, nausea and vomiting.  Genitourinary: Negative for pelvic pain, vaginal discharge and vaginal pain.    Physical Exam Updated Vital Signs BP 106/69 (BP Location: Right Arm)   Pulse 90   Temp 98.3 F (36.8 C) (Oral)   Resp 12   Ht 5' 2.5" (1.588 m)   Wt 70.3 kg   LMP 08/06/2019   SpO2 100%   BMI 27.90 kg/m   Physical Exam Vitals and nursing note reviewed.  Constitutional:      Appearance: She is not ill-appearing.  HENT:     Head: Normocephalic and atraumatic.  Eyes:     Conjunctiva/sclera: Conjunctivae normal.    Cardiovascular:     Rate and Rhythm: Normal rate and regular rhythm.     Pulses: Normal pulses.  Pulmonary:     Effort: Pulmonary effort is normal.     Breath sounds: Normal breath sounds. No wheezing, rhonchi or rales.  Abdominal:     Tenderness: There is no abdominal tenderness. There is no guarding or rebound.  Skin:    General: Skin is warm and dry.     Coloration: Skin is not jaundiced.  Neurological:     Mental Status: She is alert.     ED Results / Procedures / Treatments   Labs (all labs ordered are listed, but only abnormal results are displayed) Labs Reviewed  WET PREP, GENITAL - Abnormal; Notable for the following components:      Result Value   Clue Cells Wet Prep HPF POC PRESENT (*)    WBC, Wet Prep HPF POC FEW (*)    All other components within normal limits  URINALYSIS, ROUTINE W REFLEX MICROSCOPIC - Abnormal; Notable for the following components:   Color, Urine STRAW (*)    All other components within normal limits  RPR  HIV ANTIBODY (ROUTINE TESTING W REFLEX)  I-STAT BETA HCG BLOOD, ED ([facility], [facility], [facility] ONLY)  GC/CHLAMYDIA PROBE AMP ([place]) NOT AT [facility]    EKG None  Radiology No results found.  Procedures Procedures (including critical care time)  Medications Ordered in ED Medications - No data to display  ED Course  I have reviewed the triage vital signs and the nursing notes.  Pertinent labs & imaging results that were available during my care of the patient were reviewed by me and considered in my medical decision making (see chart for details).    MDM Rules/Calculators/A&P                      29 year old female who presents to the ED today requesting STI testing.  Concerned she was exposed as her partner joked about having chlamydia earlier this week.  He is not having any symptoms.  On arrival to the ED patient is afebrile, nontachycardic nontachypneic.  She appears to be in no acute distress.  Is that she is very angry with her  significant other.  Patient is without symptoms today we will have her self swab instead of performing pelvic exam.  Urinalysis and check for pregnancy at this time.  Patient is without symptoms I do not feel she needs treatment at this time.  She is unsure if her partner truly is positive for STIs at this time.  Have her await her results.  Have strongly encouraged that she not have intercourse until she hears about her results -have encouraged that she request records from her significant other so she can see if he did test positive or not.  If results come back positive patient encouraged to return to the ED with health department to seek treatment at that time.   Beta hcg negative U/A without infection Wet prep with clue cells however pt denying any vaginal discharge at this time. Do not want to over treat - will hold off on abx at this time.   Pt stable for discharge home. She is advised to not have intercourse with partner until she receives her results. If positive pt encouraged to return to the ED for treatment at that time. She is in agreement with plan and stable for discharge home.   This note was prepared using Dragon voice recognition software and may include unintentional dictation errors due to the inherent limitations of voice recognition software.  Final Clinical Impression(s) / ED Diagnoses Final diagnoses:  Encounter for screening examination for sexually transmitted disease    Rx / DC Orders ED Discharge Orders    None       Discharge Instructions     We have tested you for STIs including gonorrhea, chlamydia, HIV, and syphilis. We will call you IF you test positive for any of these. If positive you may return to the ED to seek treatment however given you are not having any symptoms currently we will hold off on treatment until we know your results.   Refrain from intercourse until you receive your results. Encourage all partners to go get tested and treated.   Follow  up with your PCP regarding your ED visit today. If you do not have a PCP you can follow up with [facility] for primary care needs.   Return to the ED for any worsening symptoms. You may also follow up with [facility] [place] for OBGYN related needs.        Eustaquio Maize, PA-C 08/11/19 1421    Pattricia Boss, MD 08/13/19 [phone]

## 2019-08-11 NOTE — Discharge Instructions (Signed)
We have tested you for STIs including gonorrhea, chlamydia, HIV, and syphilis. We will call you IF you test positive for any of these. If positive you may return to the ED to seek treatment however given you are not having any symptoms currently we will hold off on treatment until we know your results.   Refrain from intercourse until you receive your results. Encourage all partners to go get tested and treated.   Follow up with your PCP regarding your ED visit today. If you do not have a PCP you can follow up with [facility] for primary care needs.   Return to the ED for any worsening symptoms. You may also follow up with [facility] for OBGYN related needs.

## 2019-08-11 NOTE — ED Triage Notes (Signed)
Pt states her boyfriend was tested for STD 3-4 days ago and told her it was negative.  When they were going to have sex this morning he put a condom on and she questioned why he was putting it on and he said he had to for 7 days.  She denies any symptoms but is concerned that he has STD.

## 2019-08-12 LAB — RPR: RPR Ser Ql: NONREACTIVE

## 2019-08-13 LAB — GC/CHLAMYDIA PROBE AMP (~~LOC~~) NOT AT ARMC
Chlamydia: POSITIVE — AB
Comment: NEGATIVE
Comment: NORMAL
Neisseria Gonorrhea: NEGATIVE

## 2019-08-15 ENCOUNTER — Other Ambulatory Visit: Payer: Self-pay | Admitting: Medical

## 2019-08-15 DIAGNOSIS — A749 Chlamydial infection, unspecified: Secondary | ICD-10-CM

## 2019-08-15 MED ORDER — AZITHROMYCIN 250 MG PO TABS: 1000.0000 mg | ORAL_TABLET | Freq: Once | ORAL | 0 refills | Status: AC

## 2020-02-19 ENCOUNTER — Other Ambulatory Visit: Payer: Self-pay

## 2020-02-19 ENCOUNTER — Emergency Department (HOSPITAL_COMMUNITY)
Admission: EM | Admit: 2020-02-19 | Discharge: 2020-02-20 | Disposition: A | Discharge status: Home / Self Care | Payer: Self-pay | Attending: Emergency Medicine | Admitting: Emergency Medicine

## 2020-02-19 DIAGNOSIS — Z5321 Procedure and treatment not carried out due to patient leaving prior to being seen by health care provider: Secondary | ICD-10-CM | POA: Insufficient documentation

## 2020-02-19 DIAGNOSIS — N898 Other specified noninflammatory disorders of vagina: Secondary | ICD-10-CM | POA: Insufficient documentation

## 2020-02-19 LAB — URINALYSIS, ROUTINE W REFLEX MICROSCOPIC
Bilirubin Urine: NEGATIVE
Glucose, UA: NEGATIVE mg/dL
Hgb urine dipstick: NEGATIVE
Ketones, ur: NEGATIVE mg/dL
Leukocytes,Ua: NEGATIVE
Nitrite: NEGATIVE
Protein, ur: NEGATIVE mg/dL
Specific Gravity, Urine: 1.027 (ref 1.005–1.030)
pH: 5 (ref 5.0–8.0)

## 2020-02-19 NOTE — ED Notes (Signed)
Patient called x3 for vital signs, no answer

## 2020-02-19 NOTE — ED Triage Notes (Signed)
C/o vaginal discharge.

## 2020-02-20 ENCOUNTER — Encounter (HOSPITAL_COMMUNITY): Payer: Self-pay

## 2020-02-20 ENCOUNTER — Ambulatory Visit (HOSPITAL_COMMUNITY)
Admission: EM | Admit: 2020-02-20 | Discharge: 2020-02-20 | Disposition: A | Discharge status: Home / Self Care | Payer: Self-pay | Attending: Family Medicine | Admitting: Family Medicine

## 2020-02-20 DIAGNOSIS — N898 Other specified noninflammatory disorders of vagina: Secondary | ICD-10-CM | POA: Insufficient documentation

## 2020-02-20 LAB — POC URINE PREG, ED: Preg Test, Ur: NEGATIVE

## 2020-02-20 LAB — POCT URINALYSIS DIPSTICK, ED / UC
Bilirubin Urine: NEGATIVE
Glucose, UA: NEGATIVE mg/dL
Hgb urine dipstick: NEGATIVE
Ketones, ur: NEGATIVE mg/dL
Leukocytes,Ua: NEGATIVE
Nitrite: NEGATIVE
Protein, ur: NEGATIVE mg/dL
Specific Gravity, Urine: 1.025 (ref 1.005–1.030)
Urobilinogen, UA: 0.2 mg/dL (ref 0.0–1.0)
pH: 5.5 (ref 5.0–8.0)

## 2020-02-20 NOTE — ED Provider Notes (Signed)
[facility]   [id number] 02/20/20 Arrival Time: 1659  ASSESSMENT & PLAN:  1. Vaginal discharge     Declines empiric tx as well as RPR/HIV testing.   Discharge Instructions     We have sent testing for sexually transmitted infections. We will notify you of any positive results once they are received. If required, we will prescribe any medications you might need.  Please refrain from all sexual activity for at least the next seven days.     Without s/s of PID.  Pending: CERVICOVAGINAL ANCILLARY ONLY   U/A unremarkable. UPT negative.  Will notify of any positive results. Instructed to refrain from sexual activity for at least seven days.  Reviewed expectations re: course of current medical issues. Questions answered. Outlined signs and symptoms indicating need for more acute intervention. Patient verbalized understanding. After Visit Summary given.   SUBJECTIVE:  Tina Knox is a 29 y.o. female who presents with complaint of vaginal discharge. Onset gradual. First noticed 1-2 w ago. Describes discharge as thick and opaque with blood but just finishing her menstrual period.No specific aggravating or alleviating factors reported. Denies: urinary frequency, dysuria and gross hematuria. Afebrile. No abdominal or pelvic pain. Normal PO intake wihout n/v. No genital rashes or lesions. Reports that she is sexually active with single female partner but has ceased relations; concern for cheating. OTC treatment: none.   OBJECTIVE:  Vitals:   02/20/20 1821  BP: 127/70  Pulse: 77  Resp: 17  Temp: 97.8 F (36.6 C)  TempSrc: Oral  SpO2: 100%     General appearance: alert, cooperative, appears stated age and no distress Lungs: unlabored respirations; speaks full sentences without difficulty Back: no CVA tenderness; FROM at waist Abdomen: soft, non-tender GU: deferred Skin: warm and dry Psychological: alert and cooperative; normal mood and affect.  Results for  orders placed or performed during the hospital encounter of 02/20/20  POC Urinalysis dipstick  Result Value Ref Range   Glucose, UA NEGATIVE NEGATIVE mg/dL   Bilirubin Urine NEGATIVE NEGATIVE   Ketones, ur NEGATIVE NEGATIVE mg/dL   Specific Gravity, Urine 1.025 1.005 - 1.030   Hgb urine dipstick NEGATIVE NEGATIVE   pH 5.5 5.0 - 8.0   Protein, ur NEGATIVE NEGATIVE mg/dL   Urobilinogen, UA 0.2 0.0 - 1.0 mg/dL   Nitrite NEGATIVE NEGATIVE   Leukocytes,Ua NEGATIVE NEGATIVE  POC urine pregnancy  Result Value Ref Range   Preg Test, Ur NEGATIVE NEGATIVE    Labs Reviewed  POCT URINALYSIS DIPSTICK, ED / UC  POC URINE PREG, ED    No Known Allergies  Past Medical History:  Diagnosis Date  . Anemia    with last pregnancy  . No pertinent past medical history   . Trichomonas    Family History  Problem Relation Age of Onset  . Other Neg Hx    Social History   Socioeconomic History  . Marital status: Single    Spouse name: Not on file  . Number of children: Not on file  . Years of education: Not on file  . Highest education level: Not on file  Occupational History  . Not on file  Tobacco Use  . Smoking status: Current Every Day Smoker    Packs/day: 0.25    Types: Cigarettes  . Smokeless tobacco: Former Neurosurgeon    Quit date: 11/05/2011  Vaping Use  . Vaping Use: Never used  Substance and Sexual Activity  . Alcohol use: No  . Drug use: No  . Sexual activity:  Yes    Birth control/protection: Surgical  Other Topics Concern  . Not on file  Social History Narrative  . Not on file   Social Determinants of Health   Financial Resource Strain:   . Difficulty of Paying Living Expenses: Not on file  Food Insecurity:   . Worried About Programme researcher, broadcasting/film/video in the Last Year: Not on file  . Ran Out of Food in the Last Year: Not on file  Transportation Needs:   . Lack of Transportation (Medical): Not on file  . Lack of Transportation (Non-Medical): Not on file  Physical Activity:     . Days of Exercise per Week: Not on file  . Minutes of Exercise per Session: Not on file  Stress:   . Feeling of Stress : Not on file  Social Connections:   . Frequency of Communication with Friends and Family: Not on file  . Frequency of Social Gatherings with Friends and Family: Not on file  . Attends Religious Services: Not on file  . Active Member of Clubs or Organizations: Not on file  . Attends Banker Meetings: Not on file  . Marital Status: Not on file  Intimate Partner Violence:   . Fear of Current or Ex-Partner: Not on file  . Emotionally Abused: Not on file  . Physically Abused: Not on file  . Sexually Abused: Not on file          Mardella Layman, MD 02/20/20 1850

## 2020-02-20 NOTE — ED Triage Notes (Signed)
Pt presents with vaginal discharge for over a week.

## 2020-02-20 NOTE — Discharge Instructions (Addendum)
We have sent testing for sexually transmitted infections. We will notify you of any positive results once they are received. If required, we will prescribe any medications you might need.  Please refrain from all sexual activity for at least the next seven days.  

## 2020-02-21 ENCOUNTER — Telehealth (HOSPITAL_COMMUNITY): Payer: Self-pay | Admitting: Emergency Medicine

## 2020-02-21 LAB — CERVICOVAGINAL ANCILLARY ONLY
Bacterial Vaginitis (gardnerella): POSITIVE — AB
Candida Glabrata: NEGATIVE
Candida Vaginitis: NEGATIVE
Chlamydia: NEGATIVE
Comment: NEGATIVE
Comment: NEGATIVE
Comment: NEGATIVE
Comment: NEGATIVE
Comment: NEGATIVE
Comment: NORMAL
Neisseria Gonorrhea: NEGATIVE
Trichomonas: POSITIVE — AB

## 2020-02-21 MED ORDER — METRONIDAZOLE 500 MG PO TABS: 500.0000 mg | ORAL_TABLET | Freq: Two times a day (BID) | ORAL | 0 refills | Status: DC

## 2020-07-22 ENCOUNTER — Ambulatory Visit (HOSPITAL_COMMUNITY)
Admission: EM | Admit: 2020-07-22 | Discharge: 2020-07-22 | Disposition: A | Discharge status: Home / Self Care | Payer: Self-pay | Attending: Medical Oncology | Admitting: Medical Oncology

## 2020-07-22 ENCOUNTER — Encounter (HOSPITAL_COMMUNITY): Payer: Self-pay

## 2020-07-22 ENCOUNTER — Other Ambulatory Visit: Payer: Self-pay

## 2020-07-22 DIAGNOSIS — G43011 Migraine without aura, intractable, with status migrainosus: Secondary | ICD-10-CM

## 2020-07-22 MED ORDER — PREDNISONE 10 MG PO TABS
ORAL_TABLET | ORAL | 0 refills | Status: DC
Start: 2020-07-22 — End: 2021-09-17

## 2020-07-22 MED ORDER — SUMATRIPTAN SUCCINATE 25 MG PO TABS: 25.0000 mg | ORAL_TABLET | Freq: Once | ORAL | 0 refills | Status: DC

## 2020-07-22 NOTE — ED Triage Notes (Signed)
Pt c/o migraines x 5 days. Pt states she has not taken anything for the headaches. She states she has been sleeping all day.

## 2020-07-22 NOTE — ED Provider Notes (Signed)
[facility]    CSN: [id number] Arrival date & time: 07/22/20  1821      History   Chief Complaint Chief Complaint  Patient presents with  . Headache    HPI Tina Knox is a 30 y.o. female.   HPI  Headache: Patient reports that she has had headaches for the past 5 days.  She states that she does get migraines but this 1 has lasted a bit longer than normal.  Her current migraine accompanies her right temple area which is normally where her migraines occur.  She describes this as a throbbing headache.  Moderate in nature but failing to improve.  She states that normally her headaches improve with sleep or Tylenol.  She has tried sleeping and this has not been helpful she has not taken anything for the headaches.  She denies any visual changes, fevers, significant neck stiffness, weakness, vomiting, photophobia.  No head injury.LMC: 3 weeks ago and not planning on pregnancy per pt.  Past Medical History:  Diagnosis Date  . Anemia    with last pregnancy  . No pertinent past medical history   . Trichomonas     Patient Active Problem List   Diagnosis Date Noted  . Marijuana smoker 01/26/2011  . Anemia 01/12/2011  . Insufficient prenatal care 01/12/2011  . Trichomoniasis of vagina 01/12/2011    Past Surgical History:  Procedure Laterality Date  . BILATERAL SALPINGECTOMY  03/02/2012   Procedure: BILATERAL SALPINGECTOMY;  Surgeon: Allie Bossier, MD;  Location: [facility];  Service: Gynecology;  Laterality: Bilateral;  . INDUCED ABORTION    . NO PAST SURGERIES      OB History    Gravida  4   Para  3   Term  3   Preterm      AB  1   Living  3     SAB      IAB  1   Ectopic      Multiple      Live Births  3            Home Medications    Prior to Admission medications   Medication Sig Start Date End Date Taking? Authorizing Provider  benzonatate (TESSALON) 100 MG capsule Take 1 capsule (100 mg total) by mouth every 8 (eight) hours. 08/03/19    Renne Crigler, PA-C  ibuprofen (ADVIL,MOTRIN) 600 MG tablet Take 1 tablet (600 mg total) by mouth every 6 (six) hours as needed for headache, mild pain or moderate pain (or body aches). 07/18/15   Antony Madura, PA-C  loratadine (CLARITIN) 10 MG tablet Take 1 tablet (10 mg total) by mouth daily. 08/03/19   Renne Crigler, PA-C  metroNIDAZOLE (FLAGYL) 500 MG tablet Take 1 tablet (500 mg total) by mouth 2 (two) times daily. 02/21/20   Lamptey, Britta Mccreedy, MD  naproxen (NAPROSYN) 500 MG tablet Take 1 tablet (500 mg total) by mouth 2 (two) times daily with a meal. 04/28/19   Caccavale, Sophia, PA-C  sodium chloride (OCEAN) 0.65 % SOLN nasal spray Place 1 spray into both nostrils as needed. 07/18/15   Antony Madura, PA-C    Family History Family History  Problem Relation Age of Onset  . Other Neg Hx     Social History Social History   Tobacco Use  . Smoking status: Current Every Day Smoker    Packs/day: 0.25    Types: Cigarettes  . Smokeless tobacco: Former Neurosurgeon    Quit date: 11/05/2011  Vaping Use  .  Vaping Use: Never used  Substance Use Topics  . Alcohol use: No  . Drug use: No     Allergies   Patient has no known allergies.   Review of Systems Review of Systems  As stated above in HPI Physical Exam Triage Vital Signs ED Triage Vitals  Enc Vitals Group     BP 07/22/20 1905 (!) 111/53     Pulse Rate 07/22/20 1906 90     Resp 07/22/20 1905 20     Temp 07/22/20 1905 98.4 F (36.9 C)     Temp Source 07/22/20 1905 Oral     SpO2 07/22/20 1906 99 %     Weight --      Height --      Head Circumference --      Peak Flow --      Pain Score 07/22/20 1903 7     Pain Loc --      Pain Edu? --      Excl. in GC? --    No data found.  Updated Vital Signs BP (!) 111/53 (BP Location: Right Arm)   Pulse 90   Temp 98.4 F (36.9 C) (Oral)   Resp 20   LMP 07/15/2020 (Approximate)   SpO2 99%   Physical Exam Vitals and nursing note reviewed.  Constitutional:      General: She is  not in acute distress.    Appearance: She is well-developed. She is not ill-appearing, toxic-appearing or diaphoretic.  HENT:     Head: Normocephalic and atraumatic.     Mouth/Throat:     Mouth: Mucous membranes are moist.  Eyes:     Extraocular Movements: Extraocular movements intact.     Right eye: Normal extraocular motion and no nystagmus.     Left eye: Normal extraocular motion and no nystagmus.     Pupils: Pupils are equal, round, and reactive to light.     Right eye: Pupil is round and reactive.     Left eye: Pupil is round and reactive.  Cardiovascular:     Rate and Rhythm: Normal rate and regular rhythm.     Heart sounds: Normal heart sounds.  Pulmonary:     Effort: Pulmonary effort is normal.     Breath sounds: Normal breath sounds.  Musculoskeletal:     Cervical back: Normal range of motion and neck supple. No rigidity.  Lymphadenopathy:     Cervical: No cervical adenopathy.  Skin:    General: Skin is warm.  Neurological:     Mental Status: She is alert and oriented to person, place, and time. Mental status is at baseline.     Cranial Nerves: No cranial nerve deficit or facial asymmetry.     Sensory: No sensory deficit.     Motor: No weakness.     Coordination: Coordination normal.     Gait: Gait normal.     Deep Tendon Reflexes: Reflexes normal.  Psychiatric:        Mood and Affect: Mood normal.        Speech: Speech normal.        Behavior: Behavior normal.      UC Treatments / Results  Labs (all labs ordered are listed, but only abnormal results are displayed) Labs Reviewed - No data to display  EKG   Radiology No results found.  Procedures Procedures (including critical care time)  Medications Ordered in UC Medications - No data to display  Initial Impression / Assessment and Plan / UC Course  I have reviewed the triage vital signs and the nursing notes.  Pertinent labs & imaging results that were available during my care of the patient were  reviewed by me and considered in my medical decision making (see chart for details).     New.  Offered medication in office however patient declines as she would like pills sent into her pharmacy instead.  We will send in a steroid Dosepak along with sumatriptan.  We discussed how to use these medications along with common potential side effects and precautions.  We also discussed red flag signs and symptoms that would warrant her going to the emergency room. Final Clinical Impressions(s) / UC Diagnoses   Final diagnoses:  None   Discharge Instructions   None    ED Prescriptions    None     PDMP not reviewed this encounter.   Rushie Chestnut, [place] 07/22/20 2020

## 2020-08-01 ENCOUNTER — Encounter (HOSPITAL_COMMUNITY): Payer: Self-pay

## 2020-08-01 ENCOUNTER — Other Ambulatory Visit: Payer: Self-pay

## 2020-08-01 ENCOUNTER — Ambulatory Visit (HOSPITAL_COMMUNITY)
Admission: EM | Admit: 2020-08-01 | Discharge: 2020-08-01 | Disposition: A | Discharge status: Home / Self Care | Payer: Self-pay | Attending: Emergency Medicine | Admitting: Emergency Medicine

## 2020-08-01 DIAGNOSIS — N898 Other specified noninflammatory disorders of vagina: Secondary | ICD-10-CM

## 2020-08-01 DIAGNOSIS — Z113 Encounter for screening for infections with a predominantly sexual mode of transmission: Secondary | ICD-10-CM

## 2020-08-01 LAB — HIV ANTIBODY (ROUTINE TESTING W REFLEX): HIV Screen 4th Generation wRfx: NONREACTIVE

## 2020-08-01 NOTE — ED Triage Notes (Signed)
Pt in with c/o vaginal discharge x 3 days  Also c/o vaginal itching

## 2020-08-01 NOTE — ED Provider Notes (Signed)
[facility]    CSN: [id number] Arrival date & time: 08/01/20  1859      History   Chief Complaint Chief Complaint  Patient presents with  . Vaginal Discharge    HPI Tina Knox is a 30 y.o. female.   Patient here for evaluation of vaginal discharge, itching and odor.  Reports discharge is thick and brown.  Reports having similar symptoms in the past with BV.  Also requesting STD screening.  Denies any trauma, injury, or other precipitating event.  Denies any specific alleviating or aggravating factors.  Denies any fevers, chest pain, shortness of breath, N/V/D, numbness, tingling, weakness, abdominal pain, or headaches.   ROS: As per HPI, all other pertinent ROS negative   The history is provided by the patient.  Vaginal Discharge   Past Medical History:  Diagnosis Date  . Anemia    with last pregnancy  . No pertinent past medical history   . Trichomonas     Patient Active Problem List   Diagnosis Date Noted  . Marijuana smoker 01/26/2011  . Anemia 01/12/2011  . Insufficient prenatal care 01/12/2011  . Trichomoniasis of vagina 01/12/2011    Past Surgical History:  Procedure Laterality Date  . BILATERAL SALPINGECTOMY  03/02/2012   Procedure: BILATERAL SALPINGECTOMY;  Surgeon: Allie Bossier, MD;  Location: [facility];  Service: Gynecology;  Laterality: Bilateral;  . INDUCED ABORTION    . NO PAST SURGERIES      OB History    Gravida  4   Para  3   Term  3   Preterm      AB  1   Living  3     SAB      IAB  1   Ectopic      Multiple      Live Births  3            Home Medications    Prior to Admission medications   Medication Sig Start Date End Date Taking? Authorizing Provider  benzonatate (TESSALON) 100 MG capsule Take 1 capsule (100 mg total) by mouth every 8 (eight) hours. 08/03/19   Renne Crigler, PA-C  ibuprofen (ADVIL,MOTRIN) 600 MG tablet Take 1 tablet (600 mg total) by mouth every 6 (six) hours as needed for headache,  mild pain or moderate pain (or body aches). 07/18/15   Antony Madura, PA-C  loratadine (CLARITIN) 10 MG tablet Take 1 tablet (10 mg total) by mouth daily. 08/03/19   Renne Crigler, PA-C  metroNIDAZOLE (FLAGYL) 500 MG tablet Take 1 tablet (500 mg total) by mouth 2 (two) times daily. 02/21/20   Lamptey, Britta Mccreedy, MD  naproxen (NAPROSYN) 500 MG tablet Take 1 tablet (500 mg total) by mouth 2 (two) times daily with a meal. 04/28/19   Caccavale, Sophia, PA-C  predniSONE (DELTASONE) 10 MG tablet Take 4 tablets by mouth with breakfast for 2 days, 2 tablets by mouth for 2 days and 1 tablet by mouth for 2 days. 07/22/20   Rushie Chestnut, PA-C  sodium chloride (OCEAN) 0.65 % SOLN nasal spray Place 1 spray into both nostrils as needed. 07/18/15   Antony Madura, PA-C  SUMAtriptan (IMITREX) 25 MG tablet Take 1 tablet (25 mg total) by mouth once for 1 dose. May repeat in 2 hours if headache persists or recurs per day 07/22/20 07/22/20  Rushie Chestnut, PA-C    Family History Family History  Problem Relation Age of Onset  . Other Neg Hx  Social History Social History   Tobacco Use  . Smoking status: Current Every Day Smoker    Packs/day: 0.25    Types: Cigarettes  . Smokeless tobacco: Former Neurosurgeon    Quit date: 11/05/2011  Vaping Use  . Vaping Use: Never used  Substance Use Topics  . Alcohol use: No  . Drug use: No     Allergies   Patient has no known allergies.   Review of Systems Review of Systems  Genitourinary: Positive for vaginal discharge.  All other systems reviewed and are negative.    Physical Exam Triage Vital Signs ED Triage Vitals  Enc Vitals Group     BP 08/01/20 1920 118/80     Pulse Rate 08/01/20 1920 81     Resp 08/01/20 1920 18     Temp 08/01/20 1920 98.8 F (37.1 C)     Temp Source 08/01/20 1920 Oral     SpO2 08/01/20 1920 100 %     Weight --      Height --      Head Circumference --      Peak Flow --      Pain Score 08/01/20 1919 0     Pain Loc --      Pain  Edu? --      Excl. in GC? --    No data found.  Updated Vital Signs BP 118/80   Pulse 81   Temp 98.8 F (37.1 C) (Oral)   Resp 18   LMP 07/15/2020 (Approximate)   SpO2 100%   Visual Acuity Right Eye Distance:   Left Eye Distance:   Bilateral Distance:    Right Eye Near:   Left Eye Near:    Bilateral Near:     Physical Exam Vitals and nursing note reviewed.  Constitutional:      General: She is not in acute distress.    Appearance: Normal appearance. She is not ill-appearing, toxic-appearing or diaphoretic.  HENT:     Head: Normocephalic and atraumatic.  Eyes:     Conjunctiva/sclera: Conjunctivae normal.  Cardiovascular:     Rate and Rhythm: Normal rate.     Pulses: Normal pulses.  Pulmonary:     Effort: Pulmonary effort is normal.  Abdominal:     General: Abdomen is flat.  Genitourinary:    Comments: declines Musculoskeletal:        General: Normal range of motion.     Cervical back: Normal range of motion.  Skin:    General: Skin is warm and dry.  Neurological:     General: No focal deficit present.     Mental Status: She is alert and oriented to person, place, and time.  Psychiatric:        Mood and Affect: Mood normal.      UC Treatments / Results  Labs (all labs ordered are listed, but only abnormal results are displayed) Labs Reviewed  RPR  HIV ANTIBODY (ROUTINE TESTING W REFLEX)  CERVICOVAGINAL ANCILLARY ONLY    EKG   Radiology No results found.  Procedures Procedures (including critical care time)  Medications Ordered in UC Medications - No data to display  Initial Impression / Assessment and Plan / UC Course  I have reviewed the triage vital signs and the nursing notes.  Pertinent labs & imaging results that were available during my care of the patient were reviewed by me and considered in my medical decision making (see chart for details).    Assessment negative for red flags  or concerns.  Self swab obtained and will treat  based on results.  Also obtained lab work for HIV and RPR.  Discussed safe sex practices including condom or other barrier method use.  Follow-up as needed. Final Clinical Impressions(s) / UC Diagnoses   Final diagnoses:  Vaginal discharge  Screen for STD (sexually transmitted disease)  Vaginal itching     Discharge Instructions     We will contact you with the results from your lab work and any additional treatment.    You can use over-the-counter anti-itch cream, such as Monistat or Vagisil while waiting on your results.   Do not have sex while taking undergoing treatment for STI.  Make sure that all of your partners get tested and treated.   Use a condom or other barrier method for all sexual encounters.    Return or go to the Emergency Department if symptoms worsen or do not improve in the next few days.      ED Prescriptions    None     PDMP not reviewed this encounter.   Ivette Loyal, NP 08/01/20 [phone]

## 2020-08-01 NOTE — Discharge Instructions (Signed)
We will contact you with the results from your lab work and any additional treatment.    You can use over-the-counter anti-itch cream, such as Monistat or Vagisil while waiting on your results.   Do not have sex while taking undergoing treatment for STI.  Make sure that all of your partners get tested and treated.   Use a condom or other barrier method for all sexual encounters.    Return or go to the Emergency Department if symptoms worsen or do not improve in the next few days.

## 2020-08-02 LAB — RPR: RPR Ser Ql: NONREACTIVE

## 2020-08-04 ENCOUNTER — Telehealth (HOSPITAL_COMMUNITY): Payer: Self-pay | Admitting: Emergency Medicine

## 2020-08-04 LAB — CERVICOVAGINAL ANCILLARY ONLY
Bacterial Vaginitis (gardnerella): POSITIVE — AB
Candida Glabrata: NEGATIVE
Candida Vaginitis: NEGATIVE
Chlamydia: NEGATIVE
Comment: NEGATIVE
Comment: NEGATIVE
Comment: NEGATIVE
Comment: NEGATIVE
Comment: NEGATIVE
Comment: NORMAL
Neisseria Gonorrhea: NEGATIVE
Trichomonas: NEGATIVE

## 2020-08-04 MED ORDER — METRONIDAZOLE 500 MG PO TABS: 500.0000 mg | ORAL_TABLET | Freq: Two times a day (BID) | ORAL | 0 refills | Status: DC

## 2020-10-02 ENCOUNTER — Ambulatory Visit (HOSPITAL_COMMUNITY)
Admission: EM | Admit: 2020-10-02 | Discharge: 2020-10-02 | Disposition: A | Discharge status: Home / Self Care | Payer: Self-pay | Attending: Emergency Medicine | Admitting: Emergency Medicine

## 2020-10-02 ENCOUNTER — Encounter (HOSPITAL_COMMUNITY): Payer: Self-pay | Admitting: *Deleted

## 2020-10-02 ENCOUNTER — Other Ambulatory Visit: Payer: Self-pay

## 2020-10-02 DIAGNOSIS — Z113 Encounter for screening for infections with a predominantly sexual mode of transmission: Secondary | ICD-10-CM | POA: Insufficient documentation

## 2020-10-02 DIAGNOSIS — Z3202 Encounter for pregnancy test, result negative: Secondary | ICD-10-CM | POA: Insufficient documentation

## 2020-10-02 DIAGNOSIS — N930 Postcoital and contact bleeding: Secondary | ICD-10-CM | POA: Insufficient documentation

## 2020-10-02 LAB — POCT URINALYSIS DIPSTICK, ED / UC
Bilirubin Urine: NEGATIVE
Glucose, UA: NEGATIVE mg/dL
Hgb urine dipstick: NEGATIVE
Ketones, ur: NEGATIVE mg/dL
Leukocytes,Ua: NEGATIVE
Nitrite: NEGATIVE
Protein, ur: NEGATIVE mg/dL
Specific Gravity, Urine: >=1.03 (ref 1.005–1.030)
Urobilinogen, UA: 0.2 mg/dL (ref 0.0–1.0)
pH: 5.5 (ref 5.0–8.0)

## 2020-10-02 LAB — POC URINE PREG, ED: Preg Test, Ur: NEGATIVE

## 2020-10-02 NOTE — ED Provider Notes (Signed)
[facility]    CSN: [id number] Arrival date & time: 10/02/20  1753      History   Chief Complaint Chief Complaint  Patient presents with   Vaginal Bleeding    HPI English Basto is a 30 y.o. female.   Patient here for evaluation of bleeding after sex that has happened the last few times after intercourse.  Does report having some rough sex.  Reports bleeding is light.  Denies any trauma, injury, or other precipitating event.  Denies any specific alleviating or aggravating factors.  Denies any fevers, chest pain, shortness of breath, N/V/D, numbness, tingling, weakness, abdominal pain, or headaches.    The history is provided by the patient.  Vaginal Bleeding Associated symptoms: no vaginal discharge    Past Medical History:  Diagnosis Date   Anemia    with last pregnancy   No pertinent past medical history    Trichomonas     Patient Active Problem List   Diagnosis Date Noted   Marijuana smoker 01/26/2011   Anemia 01/12/2011   Insufficient prenatal care 01/12/2011   Trichomoniasis of vagina 01/12/2011    Past Surgical History:  Procedure Laterality Date   BILATERAL SALPINGECTOMY  03/02/2012   Procedure: BILATERAL SALPINGECTOMY;  Surgeon: Allie Bossier, MD;  Location: [facility];  Service: Gynecology;  Laterality: Bilateral;   INDUCED ABORTION     NO PAST SURGERIES      OB History     Gravida  4   Para  3   Term  3   Preterm      AB  1   Living  3      SAB      IAB  1   Ectopic      Multiple      Live Births  3            Home Medications    Prior to Admission medications   Medication Sig Start Date End Date Taking? Authorizing Provider  benzonatate (TESSALON) 100 MG capsule Take 1 capsule (100 mg total) by mouth every 8 (eight) hours. 08/03/19   Renne Crigler, PA-C  ibuprofen (ADVIL,MOTRIN) 600 MG tablet Take 1 tablet (600 mg total) by mouth every 6 (six) hours as needed for headache, mild pain or moderate pain (or body aches).  07/18/15   Antony Madura, PA-C  loratadine (CLARITIN) 10 MG tablet Take 1 tablet (10 mg total) by mouth daily. 08/03/19   Renne Crigler, PA-C  metroNIDAZOLE (FLAGYL) 500 MG tablet Take 1 tablet (500 mg total) by mouth 2 (two) times daily. 08/04/20   Lamptey, Britta Mccreedy, MD  naproxen (NAPROSYN) 500 MG tablet Take 1 tablet (500 mg total) by mouth 2 (two) times daily with a meal. 04/28/19   Caccavale, Sophia, PA-C  predniSONE (DELTASONE) 10 MG tablet Take 4 tablets by mouth with breakfast for 2 days, 2 tablets by mouth for 2 days and 1 tablet by mouth for 2 days. 07/22/20   Rushie Chestnut, PA-C  sodium chloride (OCEAN) 0.65 % SOLN nasal spray Place 1 spray into both nostrils as needed. 07/18/15   Antony Madura, PA-C  SUMAtriptan (IMITREX) 25 MG tablet Take 1 tablet (25 mg total) by mouth once for 1 dose. May repeat in 2 hours if headache persists or recurs per day 07/22/20 07/22/20  Rushie Chestnut, PA-C    Family History Family History  Problem Relation Age of Onset   Other Neg Hx     Social History Social  History   Tobacco Use   Smoking status: Every Day    Packs/day: 0.25    Types: Cigarettes   Smokeless tobacco: Former    Quit date: 11/05/2011  Vaping Use   Vaping Use: Never used  Substance Use Topics   Alcohol use: No   Drug use: No     Allergies   Patient has no known allergies.   Review of Systems Review of Systems  Genitourinary:  Positive for vaginal bleeding. Negative for vaginal discharge and vaginal pain.  All other systems reviewed and are negative.   Physical Exam Triage Vital Signs ED Triage Vitals  Enc Vitals Group     BP 10/02/20 1923 106/68     Pulse Rate 10/02/20 1923 79     Resp 10/02/20 1923 20     Temp 10/02/20 1923 98.6 F (37 C)     Temp src --      SpO2 10/02/20 1923 100 %     Weight --      Height --      Head Circumference --      Peak Flow --      Pain Score 10/02/20 1925 0     Pain Loc --      Pain Edu? --      Excl. in GC? --    No data  found.  Updated Vital Signs BP 106/68   Pulse 79   Temp 98.6 F (37 C)   Resp 20   LMP 09/13/2020 (Exact Date)   SpO2 100%   Visual Acuity Right Eye Distance:   Left Eye Distance:   Bilateral Distance:    Right Eye Near:   Left Eye Near:    Bilateral Near:     Physical Exam Vitals and nursing note reviewed.  Constitutional:      General: She is not in acute distress.    Appearance: Normal appearance. She is not ill-appearing, toxic-appearing or diaphoretic.  HENT:     Head: Normocephalic and atraumatic.  Eyes:     Conjunctiva/sclera: Conjunctivae normal.  Cardiovascular:     Rate and Rhythm: Normal rate.     Pulses: Normal pulses.  Pulmonary:     Effort: Pulmonary effort is normal.  Abdominal:     General: Abdomen is flat.  Genitourinary:    Comments: declines Musculoskeletal:        General: Normal range of motion.     Cervical back: Normal range of motion.  Skin:    General: Skin is warm and dry.  Neurological:     General: No focal deficit present.     Mental Status: She is alert and oriented to person, place, and time.  Psychiatric:        Mood and Affect: Mood normal.     UC Treatments / Results  Labs (all labs ordered are listed, but only abnormal results are displayed) Labs Reviewed  POC URINE PREG, ED  POCT URINALYSIS DIPSTICK, ED / UC  CERVICOVAGINAL ANCILLARY ONLY    EKG   Radiology No results found.  Procedures Procedures (including critical care time)  Medications Ordered in UC Medications - No data to display  Initial Impression / Assessment and Plan / UC Course  I have reviewed the triage vital signs and the nursing notes.  Pertinent labs & imaging results that were available during my care of the patient were reviewed by me and considered in my medical decision making (see chart for details).    Assessment negative for  red flags or concerns.  Urinalysis normal and pregnancy test negative.  Self swab obtained and will treat  based on results.  Postcoital bleeding possibly due to rough sex versus STI versus malignancy.  Patient encouraged to follow-up with OB/GYN especially if STI screening is negative.  Patient given information for women Center follow-up.  Discussed safe sex practices including condoms or other barrier method use.  Follow-up as needed. Final Clinical Impressions(s) / UC Diagnoses   Final diagnoses:  Postcoital bleeding  Negative pregnancy test  Screen for STD (sexually transmitted disease)     Discharge Instructions      Follow up with [facility] for further evaluation if you are still having bleeding after sex.    We will contact you with the results from your lab work and any additional treatment.    Do not have sex while taking undergoing treatment for STI.  Make sure that all of your partners get tested and treated.   Use a condom or other barrier method for all sexual encounters.    Return or go to the Emergency Department if symptoms worsen or do not improve in the next few days.      ED Prescriptions   None    PDMP not reviewed this encounter.   Ivette Loyal, NP 10/02/20 2014

## 2020-10-02 NOTE — ED Triage Notes (Signed)
Pt reports having vag bleeding after sex .

## 2020-10-02 NOTE — Discharge Instructions (Addendum)
Follow up with [facility] for further evaluation if you are still having bleeding after sex.    We will contact you with the results from your lab work and any additional treatment.    Do not have sex while taking undergoing treatment for STI.  Make sure that all of your partners get tested and treated.   Use a condom or other barrier method for all sexual encounters.    Return or go to the Emergency Department if symptoms worsen or do not improve in the next few days.

## 2020-10-03 ENCOUNTER — Telehealth (HOSPITAL_COMMUNITY): Payer: Self-pay | Admitting: Emergency Medicine

## 2020-10-03 LAB — CERVICOVAGINAL ANCILLARY ONLY
Bacterial Vaginitis (gardnerella): POSITIVE — AB
Candida Glabrata: NEGATIVE
Candida Vaginitis: NEGATIVE
Chlamydia: POSITIVE — AB
Comment: NEGATIVE
Comment: NEGATIVE
Comment: NEGATIVE
Comment: NEGATIVE
Comment: NEGATIVE
Comment: NORMAL
Neisseria Gonorrhea: POSITIVE — AB
Trichomonas: NEGATIVE

## 2020-10-03 MED ORDER — METRONIDAZOLE 0.75 % VA GEL: 1.0000 | Freq: Every day | VAGINAL | 0 refills | Status: AC

## 2020-10-03 MED ORDER — DOXYCYCLINE HYCLATE 100 MG PO CAPS
100.0000 mg | ORAL_CAPSULE | Freq: Two times a day (BID) | ORAL | 0 refills | Status: AC
Start: 2020-10-03 — End: 2020-10-10

## 2020-10-04 ENCOUNTER — Ambulatory Visit (HOSPITAL_COMMUNITY)
Admission: EM | Admit: 2020-10-04 | Discharge: 2020-10-04 | Disposition: A | Discharge status: Home / Self Care | Payer: Self-pay | Attending: Family Medicine | Admitting: Family Medicine

## 2020-10-04 ENCOUNTER — Other Ambulatory Visit: Payer: Self-pay

## 2020-10-04 ENCOUNTER — Encounter (HOSPITAL_COMMUNITY): Payer: Self-pay | Admitting: *Deleted

## 2020-10-04 DIAGNOSIS — A549 Gonococcal infection, unspecified: Secondary | ICD-10-CM

## 2020-10-04 MED ORDER — CEFTRIAXONE SODIUM 500 MG IJ SOLR
INTRAMUSCULAR | Status: AC
  Filled 2020-10-04: qty 500

## 2020-10-04 MED ORDER — LIDOCAINE HCL (PF) 1 % IJ SOLN
INTRAMUSCULAR | Status: AC
  Filled 2020-10-04: qty 2

## 2020-10-04 MED ORDER — CEFTRIAXONE SODIUM 500 MG IJ SOLR
500.0000 mg | Freq: Once | INTRAMUSCULAR | Status: AC
  Administered 2020-10-04: 500 mg via INTRAMUSCULAR

## 2020-10-04 NOTE — ED Triage Notes (Signed)
Pt presents for treatment of STD

## 2021-01-30 ENCOUNTER — Ambulatory Visit (HOSPITAL_COMMUNITY)
Admission: EM | Admit: 2021-01-30 | Discharge: 2021-01-30 | Disposition: A | Discharge status: Home / Self Care | Payer: Self-pay | Attending: Internal Medicine | Admitting: Internal Medicine

## 2021-01-30 ENCOUNTER — Encounter (HOSPITAL_COMMUNITY): Payer: Self-pay

## 2021-01-30 ENCOUNTER — Other Ambulatory Visit: Payer: Self-pay

## 2021-01-30 DIAGNOSIS — R1031 Right lower quadrant pain: Secondary | ICD-10-CM | POA: Insufficient documentation

## 2021-01-30 DIAGNOSIS — N760 Acute vaginitis: Secondary | ICD-10-CM | POA: Insufficient documentation

## 2021-01-30 LAB — POCT URINALYSIS DIPSTICK, ED / UC
Bilirubin Urine: NEGATIVE
Glucose, UA: NEGATIVE mg/dL
Ketones, ur: NEGATIVE mg/dL
Nitrite: NEGATIVE
Protein, ur: NEGATIVE mg/dL
Specific Gravity, Urine: >=1.03 (ref 1.005–1.030)
Urobilinogen, UA: 0.2 mg/dL (ref 0.0–1.0)
pH: 5.5 (ref 5.0–8.0)

## 2021-01-30 MED ORDER — CEFTRIAXONE SODIUM 500 MG IJ SOLR
500.0000 mg | Freq: Once | INTRAMUSCULAR | Status: AC
  Administered 2021-01-30: 500 mg via INTRAMUSCULAR

## 2021-01-30 MED ORDER — DOXYCYCLINE HYCLATE 100 MG PO CAPS: 100.0000 mg | ORAL_CAPSULE | Freq: Two times a day (BID) | ORAL | 0 refills | Status: AC

## 2021-01-30 MED ORDER — METRONIDAZOLE 500 MG PO TABS: 500.0000 mg | ORAL_TABLET | Freq: Two times a day (BID) | ORAL | 0 refills | Status: AC

## 2021-01-30 MED ORDER — LIDOCAINE HCL (PF) 1 % IJ SOLN
INTRAMUSCULAR | Status: AC
  Filled 2021-01-30: qty 2

## 2021-01-30 MED ORDER — CEFTRIAXONE SODIUM 500 MG IJ SOLR
INTRAMUSCULAR | Status: AC
  Filled 2021-01-30: qty 500

## 2021-01-30 NOTE — ED Provider Notes (Signed)
[name]    CSN: [id number] Arrival date & time: 01/30/21  [id number]      History   Chief Complaint Chief Complaint  Patient presents with   Vaginal Itching   Abdominal Pain    HPI Tina Knox is a 30 y.o. female.   Patient presents with concerns of left lower abdominal pain since Monday as well as vaginal itching for the last week or two. She reports the abdominal pain is constant but worse with movement. She denies any vaginal discharge but has been on her period so it is hard to tell. She reports some back pain as well. She reports one episode of nausea yesterday but no vomiting or any known fever. The patient denies any urinary symptoms. She states she had similar symptoms in the past when she "caught something". She did have unprotected intercourse on 10/18 and suspects she may have gotten something.   The history is provided by the patient.  Vaginal Itching Associated symptoms include abdominal pain. Pertinent negatives include no headaches and no shortness of breath.  Abdominal Pain Associated symptoms: nausea   Associated symptoms: no diarrhea, no dysuria, no fatigue, no fever, no hematuria, no shortness of breath, no vaginal bleeding, no vaginal discharge and no vomiting    Past Medical History:  Diagnosis Date   Anemia    with last pregnancy   No pertinent past medical history    Trichomonas     Patient Active Problem List   Diagnosis Date Noted   Marijuana smoker 01/26/2011   Anemia 01/12/2011   Insufficient prenatal care 01/12/2011   Trichomoniasis of vagina 01/12/2011    Past Surgical History:  Procedure Laterality Date   BILATERAL SALPINGECTOMY  03/02/2012   Procedure: BILATERAL SALPINGECTOMY;  Surgeon: Emily Filbert, MD;  Location: [facility];  Service: Gynecology;  Laterality: Bilateral;   INDUCED ABORTION     NO PAST SURGERIES      OB History     Gravida  4   Para  3   Term  3   Preterm      AB  1   Living  3      SAB       IAB  1   Ectopic      Multiple      Live Births  3            Home Medications    Prior to Admission medications   Medication Sig Start Date End Date Taking? Authorizing Provider  doxycycline (VIBRAMYCIN) 100 MG capsule Take 1 capsule (100 mg total) by mouth 2 (two) times daily for 14 days. 01/30/21 02/13/21 Yes Zakari Bathe L, PA  metroNIDAZOLE (FLAGYL) 500 MG tablet Take 1 tablet (500 mg total) by mouth 2 (two) times daily for 14 days. 01/30/21 02/13/21 Yes Brin Ruggerio L, PA  benzonatate (TESSALON) 100 MG capsule Take 1 capsule (100 mg total) by mouth every 8 (eight) hours. 08/03/19   Carlisle Cater, PA-C  ibuprofen (ADVIL,MOTRIN) 600 MG tablet Take 1 tablet (600 mg total) by mouth every 6 (six) hours as needed for headache, mild pain or moderate pain (or body aches). 07/18/15   Antonietta Breach, PA-C  loratadine (CLARITIN) 10 MG tablet Take 1 tablet (10 mg total) by mouth daily. 08/03/19   Carlisle Cater, PA-C  naproxen (NAPROSYN) 500 MG tablet Take 1 tablet (500 mg total) by mouth 2 (two) times daily with a meal. 04/28/19   Caccavale, Sophia, PA-C  predniSONE (DELTASONE) 10 MG  tablet Take 4 tablets by mouth with breakfast for 2 days, 2 tablets by mouth for 2 days and 1 tablet by mouth for 2 days. 07/22/20   Hughie Closs, PA-C  sodium chloride (OCEAN) 0.65 % SOLN nasal spray Place 1 spray into both nostrils as needed. 07/18/15   Antonietta Breach, PA-C  SUMAtriptan (IMITREX) 25 MG tablet Take 1 tablet (25 mg total) by mouth once for 1 dose. May repeat in 2 hours if headache persists or recurs per day 07/22/20 07/22/20  Hughie Closs, PA-C    Family History Family History  Problem Relation Age of Onset   Other Neg Hx     Social History Social History   Tobacco Use   Smoking status: Every Day    Packs/day: 0.25    Types: Cigarettes   Smokeless tobacco: Former    Quit date: 11/05/2011  Vaping Use   Vaping Use: Never used  Substance Use Topics   Alcohol use: No   Drug use: No      Allergies   Patient has no known allergies.   Review of Systems Review of Systems  Constitutional:  Negative for fatigue and fever.  Respiratory:  Negative for shortness of breath.   Gastrointestinal:  Positive for abdominal pain and nausea. Negative for diarrhea and vomiting.  Genitourinary:  Positive for pelvic pain. Negative for dysuria, frequency, genital sores, hematuria, urgency, vaginal bleeding and vaginal discharge.  Musculoskeletal:  Positive for back pain. Negative for myalgias.  Skin:  Negative for rash.  Neurological:  Negative for dizziness and headaches.    Physical Exam Triage Vital Signs ED Triage Vitals  Enc Vitals Group     BP 01/30/21 0854 111/78     Pulse Rate 01/30/21 0854 78     Resp 01/30/21 0854 17     Temp 01/30/21 0854 98.5 F (36.9 C)     Temp Source 01/30/21 0854 Oral     SpO2 01/30/21 0854 100 %     Weight --      Height --      Head Circumference --      Peak Flow --      Pain Score 01/30/21 0852 3     Pain Loc --      Pain Edu? --      Excl. in [place]? --    No data found.  Updated Vital Signs BP 111/78 (BP Location: Left Arm)   Pulse 78   Temp 98.5 F (36.9 C) (Oral)   Resp 17   LMP 01/26/2021 (Exact Date)   SpO2 100%   Visual Acuity Right Eye Distance:   Left Eye Distance:   Bilateral Distance:    Right Eye Near:   Left Eye Near:    Bilateral Near:     Physical Exam Vitals and nursing note reviewed.  Constitutional:      General: She is not in acute distress. HENT:     Head: Normocephalic.  Eyes:     Pupils: Pupils are equal, round, and reactive to light.  Cardiovascular:     Rate and Rhythm: Normal rate and regular rhythm.     Heart sounds: Normal heart sounds.  Pulmonary:     Effort: Pulmonary effort is normal.     Breath sounds: Normal breath sounds.  Abdominal:     Palpations: Abdomen is soft.     Tenderness: There is abdominal tenderness. There is no right CVA tenderness, left CVA tenderness, guarding or  rebound.  Comments: Left abdominal tenderness, mainly LLQ.   Genitourinary:    General: Normal vulva.     Pubic Area: No rash.      Cervix: Cervical bleeding present.     Comments: Menses, no notable discharge but difficult to discern.  Diffuse mild tenderness, particularly to left adnexa. No notable CMT.  Skin:    General: Skin is warm.     Findings: No rash.  Neurological:     Mental Status: She is alert.     Gait: Gait normal.  Psychiatric:        Mood and Affect: Mood normal.     UC Treatments / Results  Labs (all labs ordered are listed, but only abnormal results are displayed) Labs Reviewed  POCT URINALYSIS DIPSTICK, ED / UC - Abnormal; Notable for the following components:      Result Value   Hgb urine dipstick LARGE (*)    Leukocytes,Ua TRACE (*)    All other components within normal limits  CERVICOVAGINAL ANCILLARY ONLY    EKG   Radiology No results found.  Procedures Procedures (including critical care time)  Medications Ordered in UC Medications  cefTRIAXone (ROCEPHIN) injection 500 mg (has no administration in time range)    Initial Impression / Assessment and Plan / UC Course  I have reviewed the triage vital signs and the nursing notes.  Pertinent labs & imaging results that were available during my care of the patient were reviewed by me and considered in my medical decision making (see chart for details).     High suspicion for PID given known unprotected intercourse, vaginal irritation, abdominal and back pain, one episode of nausea, and LLQ tenderness. Will tx empirically and send out testing. Discussed abstinence until medication complete. Discussed ER precautions.   E/M: 1 acute complicated illness, 1 data (vaginitis), moderate risk due to prescription management  Final Clinical Impressions(s) / UC Diagnoses   Final diagnoses:  Right lower quadrant abdominal pain  Vaginitis and vulvovaginitis     Discharge Instructions       Injection given today. Take both antibiotics as prescribed - finish full course. This will treat for possible STI causing your symptoms. We will notify you when the test results return. No intercourse until a week after completing medications.  If worsening abdominal pain or develop new concerning symptoms such as fever or vomiting go to the ER.      ED Prescriptions     Medication Sig Dispense Auth. Provider   doxycycline (VIBRAMYCIN) 100 MG capsule Take 1 capsule (100 mg total) by mouth 2 (two) times daily for 14 days. 28 capsule Vallery Sa, Travor Royce L, PA   metroNIDAZOLE (FLAGYL) 500 MG tablet Take 1 tablet (500 mg total) by mouth 2 (two) times daily for 14 days. 28 tablet Vallery Sa, Lorrie Gargan L, PA      PDMP not reviewed this encounter.   Estanislado Pandy, [place] 01/30/21 [phone]

## 2021-01-30 NOTE — Discharge Instructions (Signed)
Injection given today. Take both antibiotics as prescribed - finish full course. This will treat for possible STI causing your symptoms. We will notify you when the test results return. No intercourse until a week after completing medications.  If worsening abdominal pain or develop new concerning symptoms such as fever or vomiting go to the ER.

## 2021-01-30 NOTE — ED Triage Notes (Signed)
Pt presents with LLQ pain x 5 days. Pt states she normally has cramps the first days of her cycle. States she has vaginal itching x 2 weeks.

## 2021-02-02 ENCOUNTER — Telehealth (HOSPITAL_COMMUNITY): Payer: Self-pay | Admitting: Emergency Medicine

## 2021-02-02 LAB — CERVICOVAGINAL ANCILLARY ONLY
Bacterial Vaginitis (gardnerella): POSITIVE — AB
Candida Glabrata: NEGATIVE
Candida Vaginitis: NEGATIVE
Chlamydia: NEGATIVE
Comment: NEGATIVE
Comment: NEGATIVE
Comment: NEGATIVE
Comment: NEGATIVE
Comment: NEGATIVE
Comment: NORMAL
Neisseria Gonorrhea: POSITIVE — AB
Trichomonas: NEGATIVE

## 2021-04-22 ENCOUNTER — Other Ambulatory Visit: Payer: Self-pay

## 2021-04-22 ENCOUNTER — Encounter (HOSPITAL_COMMUNITY): Payer: Self-pay | Admitting: Physician Assistant

## 2021-04-22 ENCOUNTER — Ambulatory Visit (HOSPITAL_COMMUNITY)
Admission: EM | Admit: 2021-04-22 | Discharge: 2021-04-22 | Disposition: A | Discharge status: Home / Self Care | Payer: Self-pay | Attending: Physician Assistant | Admitting: Physician Assistant

## 2021-04-22 DIAGNOSIS — N760 Acute vaginitis: Secondary | ICD-10-CM | POA: Insufficient documentation

## 2021-04-22 DIAGNOSIS — N898 Other specified noninflammatory disorders of vagina: Secondary | ICD-10-CM | POA: Insufficient documentation

## 2021-04-22 DIAGNOSIS — B9689 Other specified bacterial agents as the cause of diseases classified elsewhere: Secondary | ICD-10-CM | POA: Insufficient documentation

## 2021-04-22 DIAGNOSIS — Z113 Encounter for screening for infections with a predominantly sexual mode of transmission: Secondary | ICD-10-CM | POA: Insufficient documentation

## 2021-04-22 LAB — POC URINE PREG, ED: Preg Test, Ur: NEGATIVE

## 2021-04-22 LAB — POCT URINALYSIS DIPSTICK, ED / UC
Bilirubin Urine: NEGATIVE
Glucose, UA: NEGATIVE mg/dL
Hgb urine dipstick: NEGATIVE
Ketones, ur: NEGATIVE mg/dL
Leukocytes,Ua: NEGATIVE
Nitrite: NEGATIVE
Protein, ur: NEGATIVE mg/dL
Specific Gravity, Urine: 1.025 (ref 1.005–1.030)
Urobilinogen, UA: 0.2 mg/dL (ref 0.0–1.0)
pH: 7.5 (ref 5.0–8.0)

## 2021-04-22 LAB — HIV ANTIBODY (ROUTINE TESTING W REFLEX): HIV Screen 4th Generation wRfx: NONREACTIVE

## 2021-04-22 LAB — HEPATITIS C ANTIBODY: HCV Ab: NONREACTIVE

## 2021-04-22 MED ORDER — METRONIDAZOLE 500 MG PO TABS: 500.0000 mg | ORAL_TABLET | Freq: Two times a day (BID) | ORAL | 0 refills | Status: DC

## 2021-04-22 MED ORDER — FLUCONAZOLE 150 MG PO TABS
150.0000 mg | ORAL_TABLET | Freq: Once | ORAL | 0 refills | Status: AC
Start: 2021-04-22 — End: 2021-04-22

## 2021-04-22 NOTE — Discharge Instructions (Signed)
Take metronidazole twice daily for 7 days to cover for bacterial vaginosis.  I have also sent in Diflucan for yeast infection.  It is important you do not drink any alcohol while on metronidazole and for 3 days after completing course as it will cause you to vomit.  We will contact you if any of your lab work is abnormal we need to change your treatment plan.  Please abstain from sex until you receive your results.  If you are positive all partners will need to be tested and treated as well.  It is important you use condoms each sexual encounter.  If you have any worsening symptoms including abdominal pain, pelvic pain, fever, nausea, vomiting you should be seen immediately.  I do recommend you follow-up with an OB/GYN so please call to schedule an appointment with them.

## 2021-04-22 NOTE — ED Provider Notes (Signed)
[facility]    CSN: [id number] Arrival date & time: 04/22/21  1938      History   Chief Complaint Chief Complaint  Patient presents with   Vaginal Itching   vaginal odor   Rectal Pain    HPI Tina Knox is a 31 y.o. female.   Patient presents today with a several week history of intense itching of the perineum with associated vaginal discharge.  She is sexually active and reports that she had unprotected intercourse that included anal and vaginal penetration.  Since that time she has had ongoing vaginal discharge which she describes as malodorous and thick.  She has not tried any over-the-counter medication for symptom management.  She does have a history of bacterial vaginosis but states current symptoms are more extreme than previous episodes of this condition.  She denies any changes to personal hygiene products including soaps or detergents.  She denies any recent antibiotic use.  She reports symptoms are interfering with ability perform daily tibias and she is very uncomfortable.  She has no concern for pregnancy but is open to testing.  She denies pelvic pain, urinary symptoms, vaginal pain.   Past Medical History:  Diagnosis Date   Anemia    with last pregnancy   No pertinent past medical history    Trichomonas     Patient Active Problem List   Diagnosis Date Noted   Marijuana smoker 01/26/2011   Anemia 01/12/2011   Insufficient prenatal care 01/12/2011   Trichomoniasis of vagina 01/12/2011    Past Surgical History:  Procedure Laterality Date   BILATERAL SALPINGECTOMY  03/02/2012   Procedure: BILATERAL SALPINGECTOMY;  Surgeon: Emily Filbert, MD;  Location: [facility];  Service: Gynecology;  Laterality: Bilateral;   INDUCED ABORTION     NO PAST SURGERIES      OB History     Gravida  4   Para  3   Term  3   Preterm      AB  1   Living  3      SAB      IAB  1   Ectopic      Multiple      Live Births  3            Home  Medications    Prior to Admission medications   Medication Sig Start Date End Date Taking? Authorizing Provider  fluconazole (DIFLUCAN) 150 MG tablet Take 1 tablet (150 mg total) by mouth once for 1 dose. 04/22/21 04/22/21 Yes Mayra Jolliffe K, PA-C  metroNIDAZOLE (FLAGYL) 500 MG tablet Take 1 tablet (500 mg total) by mouth 2 (two) times daily. 04/22/21  Yes Kollen Armenti K, PA-C  benzonatate (TESSALON) 100 MG capsule Take 1 capsule (100 mg total) by mouth every 8 (eight) hours. 08/03/19   Carlisle Cater, PA-C  ibuprofen (ADVIL,MOTRIN) 600 MG tablet Take 1 tablet (600 mg total) by mouth every 6 (six) hours as needed for headache, mild pain or moderate pain (or body aches). 07/18/15   Antonietta Breach, PA-C  loratadine (CLARITIN) 10 MG tablet Take 1 tablet (10 mg total) by mouth daily. 08/03/19   Carlisle Cater, PA-C  naproxen (NAPROSYN) 500 MG tablet Take 1 tablet (500 mg total) by mouth 2 (two) times daily with a meal. 04/28/19   Caccavale, Sophia, PA-C  predniSONE (DELTASONE) 10 MG tablet Take 4 tablets by mouth with breakfast for 2 days, 2 tablets by mouth for 2 days and 1 tablet by mouth for  2 days. 07/22/20   Hughie Closs, PA-C  sodium chloride (OCEAN) 0.65 % SOLN nasal spray Place 1 spray into both nostrils as needed. 07/18/15   Antonietta Breach, PA-C  SUMAtriptan (IMITREX) 25 MG tablet Take 1 tablet (25 mg total) by mouth once for 1 dose. May repeat in 2 hours if headache persists or recurs per day 07/22/20 07/22/20  Hughie Closs, PA-C    Family History Family History  Problem Relation Age of Onset   Other Neg Hx     Social History Social History   Tobacco Use   Smoking status: Every Day    Packs/day: 0.25    Types: Cigarettes   Smokeless tobacco: Former    Quit date: 11/05/2011  Vaping Use   Vaping Use: Never used  Substance Use Topics   Alcohol use: No   Drug use: No     Allergies   Patient has no known allergies.   Review of Systems Review of Systems  Constitutional:  Positive for  activity change. Negative for appetite change, fatigue and fever.  Respiratory:  Negative for cough and shortness of breath.   Cardiovascular:  Negative for chest pain.  Gastrointestinal:  Positive for abdominal pain. Negative for diarrhea, nausea and vomiting.  Genitourinary:  Positive for vaginal discharge. Negative for dysuria, frequency, pelvic pain, urgency, vaginal bleeding and vaginal pain.    Physical Exam Triage Vital Signs ED Triage Vitals  Enc Vitals Group     BP 04/22/21 2000 103/68     Pulse Rate 04/22/21 2000 91     Resp 04/22/21 2000 19     Temp 04/22/21 2000 98.1 F (36.7 C)     Temp Source 04/22/21 2000 Oral     SpO2 04/22/21 2000 99 %     Weight --      Height --      Head Circumference --      Peak Flow --      Pain Score 04/22/21 1959 4     Pain Loc --      Pain Edu? --      Excl. in [place]? --    No data found.  Updated Vital Signs BP 103/68 (BP Location: Right Arm)    Pulse 91    Temp 98.1 F (36.7 C) (Oral)    Resp 19    LMP 04/16/2021 (Exact Date)    SpO2 99%   Visual Acuity Right Eye Distance:   Left Eye Distance:   Bilateral Distance:    Right Eye Near:   Left Eye Near:    Bilateral Near:     Physical Exam Vitals reviewed.  Constitutional:      General: She is awake. She is not in acute distress.    Appearance: Normal appearance. She is well-developed. She is not ill-appearing.     Comments: Very pleasant female appears stated age in no acute distress sitting comfortably in exam room  HENT:     Head: Normocephalic and atraumatic.     Mouth/Throat:     Mouth: Mucous membranes are moist.     Pharynx: Uvula midline. No oropharyngeal exudate or posterior oropharyngeal erythema.  Cardiovascular:     Rate and Rhythm: Normal rate and regular rhythm.     Heart sounds: Normal heart sounds, S1 normal and S2 normal. No murmur heard. Pulmonary:     Effort: Pulmonary effort is normal.     Breath sounds: Normal breath sounds. No wheezing, rhonchi or  rales.  Comments: Clear to auscultation bilaterally Abdominal:     General: Bowel sounds are normal.     Palpations: Abdomen is soft.     Tenderness: There is no abdominal tenderness. There is no right CVA tenderness, left CVA tenderness, guarding or rebound.     Comments: Benign abdominal exam  Genitourinary:    Labia:        Right: Rash present. No tenderness.        Left: Rash present. No tenderness.      Vagina: Vaginal discharge present. No erythema.     Cervix: Normal.     Comments: Maculopapular rash with white scale noted perineum with evidence of excoriation.  Thick white discharge noted in vaginal vault and from cervical os.  No adnexal tenderness or cervical motion tenderness. Psychiatric:        Behavior: Behavior is cooperative.     UC Treatments / Results  Labs (all labs ordered are listed, but only abnormal results are displayed) Labs Reviewed  HIV ANTIBODY (ROUTINE TESTING W REFLEX)  RPR  HEPATITIS C ANTIBODY  POCT URINALYSIS DIPSTICK, ED / UC  POC URINE PREG, ED  CERVICOVAGINAL ANCILLARY ONLY  CYTOLOGY, (ORAL, ANAL, URETHRAL) ANCILLARY ONLY    EKG   Radiology No results found.  Procedures Procedures (including critical care time)  Medications Ordered in UC Medications - No data to display  Initial Impression / Assessment and Plan / UC Course  I have reviewed the triage vital signs and the nursing notes.  Pertinent labs & imaging results that were available during my care of the patient were reviewed by me and considered in my medical decision making (see chart for details).     Urine pregnancy was negative.  UA was normal.  Concern for bacterial vaginosis and/or yeast given clinical presentation.  Given severity of symptoms will cover for both while testing is pending.  Metronidazole was sent to pharmacy and she was instructed not to drink any alcohol while on this medication due to Antabuse side effects.  Dose of Diflucan was sent to pharmacy as  well.  Recommended she use hypoallergenic soaps and detergents and use loosefitting cotton underwear.  Vaginal and anal STI swab as well as blood testing for HIV/hepatitis/syphilis was obtained today-results pending.  Discussed that she is to abstain from sexual activity until results are obtained and any treatment is completed.  Discussed the importance of safe sex practices.  Encouraged her to follow-up with OB/GYN and she was given contact information for local provider.  Discussed alarm symptoms that warrant emergent evaluation.  Strict return precautions given to which she expressed understanding.  Final Clinical Impressions(s) / UC Diagnoses   Final diagnoses:  Vaginal irritation  Bacterial vaginosis  Routine screening for STI (sexually transmitted infection)     Discharge Instructions      Take metronidazole twice daily for 7 days to cover for bacterial vaginosis.  I have also sent in Diflucan for yeast infection.  It is important you do not drink any alcohol while on metronidazole and for 3 days after completing course as it will cause you to vomit.  We will contact you if any of your lab work is abnormal we need to change your treatment plan.  Please abstain from sex until you receive your results.  If you are positive all partners will need to be tested and treated as well.  It is important you use condoms each sexual encounter.  If you have any worsening symptoms including abdominal pain, pelvic  pain, fever, nausea, vomiting you should be seen immediately.  I do recommend you follow-up with an OB/GYN so please call to schedule an appointment with them.    ED Prescriptions     Medication Sig Dispense Auth. Provider   metroNIDAZOLE (FLAGYL) 500 MG tablet Take 1 tablet (500 mg total) by mouth 2 (two) times daily. 14 tablet Darcel Frane K, PA-C   fluconazole (DIFLUCAN) 150 MG tablet Take 1 tablet (150 mg total) by mouth once for 1 dose. 1 tablet Gita Dilger, Derry Skill, PA-C      PDMP not  reviewed this encounter.   Terrilee Croak, PA-C 04/22/21 2042

## 2021-04-22 NOTE — ED Triage Notes (Signed)
Pt presents with c/o rectal pain, vaginal discharge and anal discharge. States it has been going on for 2 weeks.   States she is sexually active. Pt states she has stomach pain.

## 2021-04-23 LAB — CERVICOVAGINAL ANCILLARY ONLY
Bacterial Vaginitis (gardnerella): POSITIVE — AB
Candida Glabrata: NEGATIVE
Candida Vaginitis: NEGATIVE
Chlamydia: POSITIVE — AB
Comment: NEGATIVE
Comment: NEGATIVE
Comment: NEGATIVE
Comment: NEGATIVE
Comment: NEGATIVE
Comment: NORMAL
Neisseria Gonorrhea: POSITIVE — AB
Trichomonas: NEGATIVE

## 2021-04-23 LAB — CYTOLOGY, (ORAL, ANAL, URETHRAL) ANCILLARY ONLY
Chlamydia: NEGATIVE
Comment: NEGATIVE
Comment: NEGATIVE
Comment: NORMAL
Neisseria Gonorrhea: POSITIVE — AB
Trichomonas: NEGATIVE

## 2021-04-23 LAB — RPR: RPR Ser Ql: NONREACTIVE

## 2021-04-24 ENCOUNTER — Ambulatory Visit (HOSPITAL_COMMUNITY)
Admission: EM | Admit: 2021-04-24 | Discharge: 2021-04-24 | Disposition: A | Discharge status: Home / Self Care | Payer: PRIVATE HEALTH INSURANCE | Attending: Emergency Medicine | Admitting: Emergency Medicine

## 2021-04-24 ENCOUNTER — Telehealth (HOSPITAL_COMMUNITY): Payer: Self-pay | Admitting: Emergency Medicine

## 2021-04-24 ENCOUNTER — Other Ambulatory Visit: Payer: Self-pay

## 2021-04-24 DIAGNOSIS — A549 Gonococcal infection, unspecified: Secondary | ICD-10-CM

## 2021-04-24 MED ORDER — CEFTRIAXONE SODIUM 500 MG IJ SOLR
500.0000 mg | Freq: Once | INTRAMUSCULAR | Status: AC
  Administered 2021-04-24: 500 mg via INTRAMUSCULAR

## 2021-04-24 MED ORDER — LIDOCAINE HCL (PF) 1 % IJ SOLN
INTRAMUSCULAR | Status: AC
  Filled 2021-04-24: qty 2

## 2021-04-24 MED ORDER — DOXYCYCLINE HYCLATE 100 MG PO CAPS: 100.0000 mg | ORAL_CAPSULE | Freq: Two times a day (BID) | ORAL | 0 refills | Status: AC

## 2021-04-24 MED ORDER — CEFTRIAXONE SODIUM 500 MG IJ SOLR
INTRAMUSCULAR | Status: AC
  Filled 2021-04-24: qty 500

## 2021-04-24 NOTE — ED Triage Notes (Signed)
Pt presents for STD treatment of gonorrhea with 500 mg of rocephin Im per protocol and provider orders.

## 2021-04-24 NOTE — Telephone Encounter (Signed)
Per protocol, patient will need treatment with IM Rocephin 500mg  for positive Gonorrhea, and doxycycline.   Patient went home on Metronidazole Attempted to reach patient x 1, LVM Prescription sent to pharmacy on file [facility] notified

## 2021-09-17 ENCOUNTER — Encounter: Payer: Self-pay | Admitting: Obstetrics and Gynecology

## 2021-09-17 ENCOUNTER — Other Ambulatory Visit (HOSPITAL_COMMUNITY)
Admission: RE | Admit: 2021-09-17 | Discharge: 2021-09-17 | Disposition: A | Discharge status: Home / Self Care | Payer: PRIVATE HEALTH INSURANCE | Source: Ambulatory Visit | Attending: Obstetrics and Gynecology | Admitting: Obstetrics and Gynecology

## 2021-09-17 ENCOUNTER — Other Ambulatory Visit: Payer: Self-pay

## 2021-09-17 ENCOUNTER — Ambulatory Visit (INDEPENDENT_AMBULATORY_CARE_PROVIDER_SITE_OTHER): Payer: PRIVATE HEALTH INSURANCE | Admitting: Obstetrics and Gynecology

## 2021-09-17 VITALS — HR 94 | Wt 171.0 lb

## 2021-09-17 DIAGNOSIS — Z01419 Encounter for gynecological examination (general) (routine) without abnormal findings: Secondary | ICD-10-CM | POA: Diagnosis not present

## 2021-09-17 DIAGNOSIS — N898 Other specified noninflammatory disorders of vagina: Secondary | ICD-10-CM

## 2021-09-17 DIAGNOSIS — Z202 Contact with and (suspected) exposure to infections with a predominantly sexual mode of transmission: Secondary | ICD-10-CM

## 2021-09-17 DIAGNOSIS — Z862 Personal history of diseases of the blood and blood-forming organs and certain disorders involving the immune mechanism: Secondary | ICD-10-CM

## 2021-09-17 NOTE — Progress Notes (Signed)
Obstetrics and Gynecology Annual Patient Evaluation  Appointment Date: 09/17/2021  OBGYN Clinic: [facility]  Primary Care Provider: None  Referring Provider: self  Chief Complaint:  Chief Complaint  Patient presents with   Gynecologic Exam  Vaginal itching and discharge  History of Present Illness: Tina Knox is a 31 y.o. [phone] (Patient's last menstrual period was 08/29/2021 (exact date).), seen for the above chief complaint. Her past medical history is significant for h/o STIs, h/o BTL  Vaginal itching and discharge for past few months; pt went to eval in early 2023 and dx with bv but no help with flagyl   Review of Systems: Pertinent items noted in HPI and remainder of comprehensive ROS otherwise negative.   Patient Active Problem List   Diagnosis Date Noted   Marijuana smoker 01/26/2011   Anemia 01/12/2011    Past Medical History:  Past Medical History:  Diagnosis Date   Anemia    with last pregnancy   Trichomonas     Past Surgical History:  Past Surgical History:  Procedure Laterality Date   BILATERAL SALPINGECTOMY  03/02/2012   Procedure: BILATERAL SALPINGECTOMY;  Surgeon: Allie Bossier, MD;  Location: [facility];  Service: Gynecology;  Laterality: Bilateral;   INDUCED ABORTION      Past Obstetrical History:  OB History  Gravida Para Term Preterm AB Living  4 3 3   1 3   SAB IAB Ectopic Multiple Live Births    1     3    # Outcome Date GA Lbr Len/2nd Weight Sex Delivery Anes PTL Lv  4 Term 03/01/12 [redacted]w[redacted]d 09:09 / 00:58 8 lb 0.9 oz (3.653 kg) F Vag-Vacuum None  LIV  3 IAB 2013          2 Term 03/18/11 [redacted]w[redacted]d 13:30 / 00:34 7 lb 15.9 oz (3.625 kg) M Vag-Spont None  LIV     Birth Comments: Normal  1 Term 08/22/07 [redacted]w[redacted]d  8 lb 1 oz (3.657 kg) F Vag-Spont None  LIV    Past Gynecological History: As per HPI Periods: <1wk, not heavy or painful, regular, qmonth History of Pap Smear(s): unknown  Social History:  Social  History   Socioeconomic History   Marital status: Single    Spouse name: Not on file   Number of children: Not on file   Years of education: Not on file   Highest education level: Not on file  Occupational History   Not on file  Tobacco Use   Smoking status: Every Day    Packs/day: 0.25    Types: Cigarettes   Smokeless tobacco: Former    Quit date: 11/05/2011  Vaping Use   Vaping Use: Never used  Substance and Sexual Activity   Alcohol use: No   Drug use: No   Sexual activity: Yes    Birth control/protection: Surgical  Other Topics Concern   Not on file  Social History Narrative   Not on file   Social Determinants of Health   Financial Resource Strain: Not on file  Food Insecurity: Not on file  Transportation Needs: Not on file  Physical Activity: Not on file  Stress: Not on file  Social Connections: Not on file  Intimate Partner Violence: Not on file    Family History: None  Medications: None   Allergies Patient has no known allergies.   Physical Exam:  Pulse 94   Wt 171 lb (77.6 kg)   LMP 08/29/2021 (Exact Date)   BMI 30.78  kg/m  Body mass index is 30.78 kg/m. General appearance: Well nourished, well developed female in no acute distress.  Neck:  Supple, normal appearance, and no thyromegaly  Cardiovascular: normal s1 and s2.  No murmurs, rubs or gallops. Respiratory:  Clear to auscultation bilateral. Normal respiratory effort Abdomen: positive bowel sounds and no masses, hernias; diffusely non tender to palpation, non distended Breasts: patient declines any breast s/s. Neuro/Psych:  Normal mood and affect.  Skin:  Warm and dry.  Lymphatic:  No inguinal lymphadenopathy.   Pelvic exam: is not limited by body habitus EGBUS: within normal limits except for mild b/l erythema, nttp Vagina: +white d/c in vault, no blood Cervix: normal appearing cervix without tenderness, discharge or lesions. Uterus:  nonenlarged and non tender Adnexa:  normal adnexa  and no mass, fullness, tenderness Rectovaginal: deferred  Laboratory: none  Radiology: none  Assessment: pt stable  Plan: 1. STD exposure - Cytology - PAP( [place]) - Cervicovaginal ancillary only( [place]) - RPR+HBsAg+HCVAb+...  2. Vagina itching - Cytology - PAP( [place]) - Cervicovaginal ancillary only( [place]) - RPR+HBsAg+HCVAb+...  3. Well woman exam Pt doesn't have a pcp, is fasting and is amenable to basic labs - CBC - Comprehensive metabolic panel - Lipid panel - Hemoglobin A1c - TSH Rfx on Abnormal to Free T4 - Urinalysis, Routine w reflex microscopic  4. History of anemia - CBC  Orders Placed This Encounter  Procedures   RPR+HBsAg+HCVAb+...   CBC   Comprehensive metabolic panel   Lipid panel   Hemoglobin A1c   TSH Rfx on Abnormal to Free T4   Urinalysis, Routine w reflex microscopic    RTC PRN  Cornelia Copa MD Attending Center [facility])

## 2021-09-17 NOTE — Progress Notes (Signed)
Patient here for vaginal itching that started roughly 1 year ago. Patient has significant hx of STDs and would like to be screened for them today.   Last time she had protected sex was "2 months ago"

## 2021-09-18 LAB — URINALYSIS, ROUTINE W REFLEX MICROSCOPIC
Bilirubin, UA: NEGATIVE
Glucose, UA: NEGATIVE
Ketones, UA: NEGATIVE
Leukocytes,UA: NEGATIVE
Nitrite, UA: NEGATIVE
RBC, UA: NEGATIVE
Specific Gravity, UA: 1.022 (ref 1.005–1.030)
Urobilinogen, Ur: 0.2 mg/dL (ref 0.2–1.0)
pH, UA: 7 (ref 5.0–7.5)

## 2021-09-18 LAB — LIPID PANEL
Chol/HDL Ratio: 3.8 ratio (ref 0.0–4.4)
Cholesterol, Total: 151 mg/dL (ref 100–199)
HDL: 40 mg/dL (ref >39)
LDL Chol Calc (NIH): 95 mg/dL (ref 0–99)
Triglycerides: 84 mg/dL (ref 0–149)
VLDL Cholesterol Cal: 16 mg/dL (ref 5–40)

## 2021-09-18 LAB — CBC
Hematocrit: 32 % — ABNORMAL LOW (ref 34.0–46.6)
Hemoglobin: 9.4 g/dL — ABNORMAL LOW (ref 11.1–15.9)
MCH: 21 pg — ABNORMAL LOW (ref 26.6–33.0)
MCHC: 29.4 g/dL — ABNORMAL LOW (ref 31.5–35.7)
MCV: 71 fL — ABNORMAL LOW (ref 79–97)
Platelets: 296 10*3/uL (ref 150–450)
RBC: 4.48 x10E6/uL (ref 3.77–5.28)
RDW: 18.8 % — ABNORMAL HIGH (ref 11.7–15.4)
WBC: 5.8 10*3/uL (ref 3.4–10.8)

## 2021-09-18 LAB — CERVICOVAGINAL ANCILLARY ONLY
Bacterial Vaginitis (gardnerella): POSITIVE — AB
Candida Glabrata: NEGATIVE
Candida Vaginitis: NEGATIVE
Chlamydia: NEGATIVE
Comment: NEGATIVE
Comment: NEGATIVE
Comment: NEGATIVE
Comment: NEGATIVE
Comment: NEGATIVE
Comment: NORMAL
Neisseria Gonorrhea: NEGATIVE
Trichomonas: NEGATIVE

## 2021-09-18 LAB — COMPREHENSIVE METABOLIC PANEL
ALT: 15 IU/L (ref 0–32)
AST: 23 IU/L (ref 0–40)
Albumin/Globulin Ratio: 1.6 (ref 1.2–2.2)
Albumin: 4.4 g/dL (ref 3.9–5.0)
Alkaline Phosphatase: 52 IU/L (ref 44–121)
BUN/Creatinine Ratio: 7 — ABNORMAL LOW (ref 9–23)
BUN: 5 mg/dL — ABNORMAL LOW (ref 6–20)
Bilirubin Total: 0.4 mg/dL (ref 0.0–1.2)
CO2: 23 mmol/L (ref 20–29)
Calcium: 9.7 mg/dL (ref 8.7–10.2)
Chloride: 104 mmol/L (ref 96–106)
Creatinine, Ser: 0.7 mg/dL (ref 0.57–1.00)
Globulin, Total: 2.8 g/dL (ref 1.5–4.5)
Glucose: 96 mg/dL (ref 70–99)
Potassium: 4.7 mmol/L (ref 3.5–5.2)
Sodium: 137 mmol/L (ref 134–144)
Total Protein: 7.2 g/dL (ref 6.0–8.5)
eGFR: 119 mL/min/{1.73_m2} (ref >59)

## 2021-09-18 LAB — HEMOGLOBIN A1C
Est. average glucose Bld gHb Est-mCnc: 117 mg/dL
Hgb A1c MFr Bld: 5.7 % — ABNORMAL HIGH (ref 4.8–5.6)

## 2021-09-18 LAB — RPR+HBSAG+HCVAB+...
HIV Screen 4th Generation wRfx: NONREACTIVE
Hep C Virus Ab: NONREACTIVE
Hepatitis B Surface Ag: NEGATIVE
RPR Ser Ql: NONREACTIVE

## 2021-09-18 LAB — TSH RFX ON ABNORMAL TO FREE T4: TSH: 0.563 u[IU]/mL (ref 0.450–4.500)

## 2021-09-19 ENCOUNTER — Other Ambulatory Visit: Payer: Self-pay | Admitting: Obstetrics and Gynecology

## 2021-09-19 ENCOUNTER — Encounter: Payer: Self-pay | Admitting: Obstetrics and Gynecology

## 2021-09-19 DIAGNOSIS — R7303 Prediabetes: Secondary | ICD-10-CM | POA: Insufficient documentation

## 2021-09-19 MED ORDER — METRONIDAZOLE 500 MG PO TABS: 500.0000 mg | ORAL_TABLET | Freq: Two times a day (BID) | ORAL | 0 refills | Status: AC

## 2021-09-21 LAB — CYTOLOGY - PAP
Comment: NEGATIVE
Diagnosis: NEGATIVE
High risk HPV: NEGATIVE

## 2021-09-23 ENCOUNTER — Telehealth: Payer: Self-pay | Admitting: Lactation Services

## 2021-09-23 DIAGNOSIS — Z862 Personal history of diseases of the blood and blood-forming organs and certain disorders involving the immune mechanism: Secondary | ICD-10-CM

## 2021-09-23 NOTE — Telephone Encounter (Signed)
Called patient to schedule for her to come in for anemia panel. Reviewed with patient that she is anemic and Dr. Would like to run further studies, patient voiced understanding. Patient agreeable to coming on on Friday. Appointment made and patient informed of date and time. Patient with no other questions or concerns at this time.

## 2021-09-23 NOTE — Telephone Encounter (Signed)
-----   Message from [place] Bing, MD sent at 09/23/2021  2:31 PM EDT ----- Regarding: RE: add on anemia profile b to existing labwork Yes please. thanks ----- Message ----- From: Ed Blalock, RN Sent: 09/23/2021   2:11 PM EDT To: [place] Bing, MD Subject: RE: add on anemia profile b to existing labw#  Dr. Vergie Living,   Per Lab that is done on a [place] tube and it is only good for 24 hours. So not able to run on last specimen. Do you want her scheduled to come in to do the anemia panel?   Thanks Jasmine December, RN ----- Message ----- From: [place] Bing, MD Sent: 09/19/2021   1:09 PM EDT To: [facility] Clinical Pool Subject: add on anemia profile b to existing labwork    thanks

## 2021-09-25 ENCOUNTER — Other Ambulatory Visit: Payer: PRIVATE HEALTH INSURANCE

## 2021-09-25 ENCOUNTER — Other Ambulatory Visit: Payer: Self-pay

## 2021-09-25 DIAGNOSIS — Z862 Personal history of diseases of the blood and blood-forming organs and certain disorders involving the immune mechanism: Secondary | ICD-10-CM

## 2021-09-26 LAB — ANEMIA PROFILE B
Basophils Absolute: 0 10*3/uL (ref 0.0–0.2)
Basos: 0 %
EOS (ABSOLUTE): 0.1 10*3/uL (ref 0.0–0.4)
Eos: 1 %
Ferritin: 8 ng/mL — ABNORMAL LOW (ref 15–150)
Folate: 12.6 ng/mL (ref >3.0)
Hematocrit: 29.7 % — ABNORMAL LOW (ref 34.0–46.6)
Hemoglobin: 9 g/dL — ABNORMAL LOW (ref 11.1–15.9)
Immature Grans (Abs): 0 10*3/uL (ref 0.0–0.1)
Immature Granulocytes: 0 %
Iron Saturation: 4 % — CL (ref 15–55)
Iron: 15 ug/dL — ABNORMAL LOW (ref 27–159)
Lymphocytes Absolute: 2.6 10*3/uL (ref 0.7–3.1)
Lymphs: 41 %
MCH: 21.4 pg — ABNORMAL LOW (ref 26.6–33.0)
MCHC: 30.3 g/dL — ABNORMAL LOW (ref 31.5–35.7)
MCV: 71 fL — ABNORMAL LOW (ref 79–97)
Monocytes Absolute: 0.5 10*3/uL (ref 0.1–0.9)
Monocytes: 8 %
Neutrophils Absolute: 3.2 10*3/uL (ref 1.4–7.0)
Neutrophils: 50 %
Platelets: 381 10*3/uL (ref 150–450)
RBC: 4.21 x10E6/uL (ref 3.77–5.28)
RDW: 18.8 % — ABNORMAL HIGH (ref 11.7–15.4)
Retic Ct Pct: 1.2 % (ref 0.6–2.6)
Total Iron Binding Capacity: 374 ug/dL (ref 250–450)
UIBC: 359 ug/dL (ref 131–425)
Vitamin B-12: 594 pg/mL (ref 232–1245)
WBC: 6.5 10*3/uL (ref 3.4–10.8)

## 2021-11-29 ENCOUNTER — Encounter (HOSPITAL_COMMUNITY): Payer: Self-pay | Admitting: *Deleted

## 2021-11-29 ENCOUNTER — Ambulatory Visit (HOSPITAL_COMMUNITY)
Admission: EM | Admit: 2021-11-29 | Discharge: 2021-11-29 | Disposition: A | Discharge status: Home / Self Care | Payer: Self-pay | Attending: Emergency Medicine | Admitting: Emergency Medicine

## 2021-11-29 DIAGNOSIS — N898 Other specified noninflammatory disorders of vagina: Secondary | ICD-10-CM | POA: Insufficient documentation

## 2021-11-29 NOTE — Discharge Instructions (Addendum)
We will call you if anything on your swab returns positive. Please abstain from sexual intercourse until your results return.  Return to urgent care with any concerns or symptoms.

## 2021-11-29 NOTE — ED Triage Notes (Signed)
C/O malodorous vaginal discharge onset 2 days ago, which has resolved, but wishes to be checked.

## 2021-11-29 NOTE — ED Provider Notes (Signed)
[facility]    CSN: [id number] Arrival date & time: 11/29/21  1730      History   Chief Complaint Chief Complaint  Patient presents with   Vaginal Discharge    HPI Torri Tapper is a 31 y.o. female.  Presents with vaginal discharge. Reports on Friday, 2 days ago, she had malodorous discharge. It was only 1 occurrence and has not continued. Presents today for STD testing. She has had recent unprotected intercourse but no known exposures. History of recurrent BV  LMP 8/28  Past Medical History:  Diagnosis Date   Anemia    with last pregnancy   Trichomonas     Patient Active Problem List   Diagnosis Date Noted   Prediabetes 09/19/2021   Marijuana smoker 01/26/2011   Anemia 01/12/2011    Past Surgical History:  Procedure Laterality Date   BILATERAL SALPINGECTOMY  03/02/2012   Procedure: BILATERAL SALPINGECTOMY;  Surgeon: Allie Bossier, MD;  Location: [facility];  Service: Gynecology;  Laterality: Bilateral;   INDUCED ABORTION      OB History     Gravida  4   Para  3   Term  3   Preterm      AB  1   Living  3      SAB      IAB  1   Ectopic      Multiple      Live Births  3            Home Medications    Prior to Admission medications   Medication Sig Start Date End Date Taking? Authorizing Provider  SUMAtriptan (IMITREX) 25 MG tablet Take 1 tablet (25 mg total) by mouth once for 1 dose. May repeat in 2 hours if headache persists or recurs per day 07/22/20 07/22/20  Rushie Chestnut, PA-C    Family History Family History  Problem Relation Age of Onset   Other Neg Hx     Social History Social History   Tobacco Use   Smoking status: Every Day    Packs/day: 0.25    Types: Cigarettes   Smokeless tobacco: Former    Quit date: 11/05/2011  Vaping Use   Vaping Use: Never used  Substance Use Topics   Alcohol use: No   Drug use: Not Currently    Types: Marijuana     Allergies   Patient has no known  allergies.   Review of Systems Review of Systems  Genitourinary:  Positive for vaginal discharge.   Per HPI  Physical Exam Triage Vital Signs ED Triage Vitals  Enc Vitals Group     BP 11/29/21 1805 113/70     Pulse Rate 11/29/21 1805 75     Resp 11/29/21 1805 16     Temp 11/29/21 1805 98.1 F (36.7 C)     Temp src --      SpO2 11/29/21 1805 100 %     Weight --      Height --      Head Circumference --      Peak Flow --      Pain Score 11/29/21 1807 0     Pain Loc --      Pain Edu? --      Excl. in [place]? --    No data found.  Updated Vital Signs BP 113/70   Pulse 75   Temp 98.1 F (36.7 C)   Resp 16   LMP 11/16/2021 (Exact Date)  SpO2 100%   Physical Exam Vitals and nursing note reviewed.  Constitutional:      General: She is not in acute distress.    Appearance: Normal appearance.     Comments: Very pleasant, polite  HENT:     Mouth/Throat:     Pharynx: Oropharynx is clear.  Cardiovascular:     Rate and Rhythm: Normal rate and regular rhythm.     Pulses: Normal pulses.  Pulmonary:     Effort: Pulmonary effort is normal.  Neurological:     Mental Status: She is alert and oriented to person, place, and time.     UC Treatments / Results  Labs (all labs ordered are listed, but only abnormal results are displayed) Labs Reviewed  CERVICOVAGINAL ANCILLARY ONLY    EKG   Radiology No results found.  Procedures Procedures (including critical care time)  Medications Ordered in UC Medications - No data to display  Initial Impression / Assessment and Plan / UC Course  I have reviewed the triage vital signs and the nursing notes.  Pertinent labs & imaging results that were available during my care of the patient were reviewed by me and considered in my medical decision making (see chart for details).  Cytology swab pending at this time. If she is positive for BV, patient would like the vaginal application of flagyl instead of pills. Return  precautions discussed. Patient agrees to plan  Final Clinical Impressions(s) / UC Diagnoses   Final diagnoses:  Vaginal discharge     Discharge Instructions      We will call you if anything on your swab returns positive. Please abstain from sexual intercourse until your results return.  Return to urgent care with any concerns or symptoms.    ED Prescriptions   None    PDMP not reviewed this encounter.   Kathrine Haddock 11/29/21 1839

## 2021-11-30 LAB — CERVICOVAGINAL ANCILLARY ONLY
Bacterial Vaginitis (gardnerella): POSITIVE — AB
Candida Glabrata: NEGATIVE
Candida Vaginitis: NEGATIVE
Chlamydia: NEGATIVE
Comment: NEGATIVE
Comment: NEGATIVE
Comment: NEGATIVE
Comment: NEGATIVE
Comment: NEGATIVE
Comment: NORMAL
Neisseria Gonorrhea: NEGATIVE
Trichomonas: NEGATIVE

## 2021-12-01 ENCOUNTER — Encounter: Payer: Self-pay | Admitting: Obstetrics and Gynecology

## 2021-12-01 ENCOUNTER — Telehealth: Payer: Self-pay | Admitting: Physician Assistant

## 2021-12-01 ENCOUNTER — Telehealth (HOSPITAL_COMMUNITY): Payer: Self-pay | Admitting: Emergency Medicine

## 2021-12-01 DIAGNOSIS — D509 Iron deficiency anemia, unspecified: Secondary | ICD-10-CM

## 2021-12-01 MED ORDER — METRONIDAZOLE 0.75 % VA GEL: 1.0000 | Freq: Every day | VAGINAL | 0 refills | Status: AC

## 2021-12-01 MED ORDER — METRONIDAZOLE 500 MG PO TABS: 500.0000 mg | ORAL_TABLET | Freq: Two times a day (BID) | ORAL | 0 refills | Status: DC

## 2021-12-01 NOTE — Telephone Encounter (Signed)
Scheduled appt per 9/12 referral. Pt is aware of appt date and time. Pt is aware to arrive 15 mins prior to appt time and to bring and updated insurance card. Pt is aware of appt location.   

## 2021-12-01 NOTE — Telephone Encounter (Signed)
Patient called back and prefers vaginal gel.   Resent and added oral pills to allergy list since patient has been sent the wrong prescription before

## 2021-12-20 NOTE — Progress Notes (Deleted)
[facility] [phone]   [phone]  CONSULT NOTE  REFERRING PHYSICIAN: Dr. Vergie Living  REASON FOR CONSULTATION:  Iron Deficiency Anemia  HPI Tina Knox is a 31 y.o. female with a past medical history significant for prediabetes, anemia, and ***is referred to the clinic for iron deficiency and PE anemia.  The patient was referred by her PCP on 12/01/2021.  Most of the history was obtained through chart review.  Oldest recods 14 years ago. No anemia. Appears anemia started mild 2012. Lowest Hbg 7.9 01/12/2011. Been Anemic since then. No IV iron that I can see. I don't see iron was ever drawn.    Her most recent labs CBC is from 09/17/2021. She had repeat lab work performed that day for her history of iron deficiency anemia and it showed microcytic anemia with a Hbg of 9.4 and MCV low at 71.   Iron supplement? How long? Compliant??  She is unsure of the milligram dose of her iron. She reports she takes this daily???.  She denies any GI upset.    Ever have colonoscopy??? [place] need blood or IV iron???  To the patient's knowledge, she reports she was first told she was iron deficient in ***.   The oldest records available to me are from ***.  I do not see that she has had any anemia only iron deficiency.  She has never required an iron infusion or blood transfusion before.   Overall, the patient is feeling *** well today.  She denies any abnormal bleeding or bruising including epistaxis, gingival bleeding, hemoptysis, hematemesis, melena, or hematochezia.  Denies easy bruising.  She says she has ***menstrual cycles.  She follows with Dr. Marland Kitchen from OB/GYN.  She has a menstrual cycle approximately once every *** weeks but reports ***spotting approximately ***.  She reports her cycles are ***for the first ****days with passage of *** clots.  She *** estimates *** pads ***s through today.  She denies any significant fatigue today.  Denies any lightheadedness or  cold intolerance.  *** The patient reports craving ice chips.  She denies any kidney disease.  Denies any bariatric surgery.  Denies any unexplained weight loss.  Sunexplained weight loss.  She denies any particular dietary habits such as being a vegan or vegetarian and has a well-rounded diet.  She denies frequent NSAID use.       HPI  Past Medical History:  Diagnosis Date   Anemia    with last pregnancy   Trichomonas     Past Surgical History:  Procedure Laterality Date   BILATERAL SALPINGECTOMY  03/02/2012   Procedure: BILATERAL SALPINGECTOMY;  Surgeon: Allie Bossier, MD;  Location: [facility];  Service: Gynecology;  Laterality: Bilateral;   INDUCED ABORTION      Family History  Problem Relation Age of Onset   Other Neg Hx     Social History Social History   Tobacco Use   Smoking status: Every Day    Packs/day: 0.25    Types: Cigarettes   Smokeless tobacco: Former    Quit date: 11/05/2011  Vaping Use   Vaping Use: Never used  Substance Use Topics   Alcohol use: No   Drug use: Not Currently    Types: Marijuana    Allergies  Allergen Reactions   Metronidazole Nausea And Vomiting    Patient prefers the gel    Current Outpatient Medications  Medication Sig Dispense Refill   metroNIDAZOLE (FLAGYL) 500 MG tablet Take 1 tablet (  500 mg total) by mouth 2 (two) times daily. 14 tablet 0   SUMAtriptan (IMITREX) 25 MG tablet Take 1 tablet (25 mg total) by mouth once for 1 dose. May repeat in 2 hours if headache persists or recurs per day 10 tablet 0   No current facility-administered medications for this visit.    REVIEW OF SYSTEMS:   Review of Systems  Constitutional: Negative for appetite change, chills, fatigue, fever and unexpected weight change.  HENT:   Negative for mouth sores, nosebleeds, sore throat and trouble swallowing.   Eyes: Negative for eye problems and icterus.  Respiratory: Negative for cough, hemoptysis, shortness of breath and wheezing.    Cardiovascular: Negative for chest pain and leg swelling.  Gastrointestinal: Negative for abdominal pain, constipation, diarrhea, nausea and vomiting.  Genitourinary: Negative for bladder incontinence, difficulty urinating, dysuria, frequency and hematuria.   Musculoskeletal: Negative for back pain, gait problem, neck pain and neck stiffness.  Skin: Negative for itching and rash.  Neurological: Negative for dizziness, extremity weakness, gait problem, headaches, light-headedness and seizures.  Hematological: Negative for adenopathy. Does not bruise/bleed easily.  Psychiatric/Behavioral: Negative for confusion, depression and sleep disturbance. The patient is not nervous/anxious.     PHYSICAL EXAMINATION:  Last menstrual period 11/16/2021.  ECOG PERFORMANCE STATUS: {CHL ONC ECOG [id number]  Physical Exam  Constitutional: Oriented to person, place, and time and well-developed, well-nourished, and in no distress. No distress.  HENT:  Head: Normocephalic and atraumatic.  Mouth/Throat: Oropharynx is clear and moist. No oropharyngeal exudate.  Eyes: Conjunctivae are normal. Right eye exhibits no discharge. Left eye exhibits no discharge. No scleral icterus.  Neck: Normal range of motion. Neck supple.  Cardiovascular: Normal rate, regular rhythm, normal heart sounds and intact distal pulses.   Pulmonary/Chest: Effort normal and breath sounds normal. No respiratory distress. No wheezes. No rales.  Abdominal: Soft. Bowel sounds are normal. Exhibits no distension and no mass. There is no tenderness.  Musculoskeletal: Normal range of motion. Exhibits no edema.  Lymphadenopathy:    No cervical adenopathy.  Neurological: Alert and oriented to person, place, and time. Exhibits normal muscle tone. Gait normal. Coordination normal.  Skin: Skin is warm and dry. No rash noted. Not diaphoretic. No erythema. No pallor.  Psychiatric: Mood, memory and judgment normal.  Vitals reviewed.  LABORATORY  DATA: Lab Results  Component Value Date   WBC 6.5 09/25/2021   HGB 9.0 (L) 09/25/2021   HCT 29.7 (L) 09/25/2021   MCV 71 (L) 09/25/2021   PLT 381 09/25/2021      Chemistry      Component Value Date/Time   NA 137 09/17/2021 1005   K 4.7 09/17/2021 1005   CL 104 09/17/2021 1005   CO2 23 09/17/2021 1005   BUN 5 (L) 09/17/2021 1005   CREATININE 0.70 09/17/2021 1005      Component Value Date/Time   CALCIUM 9.7 09/17/2021 1005   ALKPHOS 52 09/17/2021 1005   AST 23 09/17/2021 1005   ALT 15 09/17/2021 1005   BILITOT 0.4 09/17/2021 1005       RADIOGRAPHIC STUDIES: No results found.  ASSESSMENT: This is a very pleasant 31 year old African-American female referred to clinic for iron deficiency anemia.  The patient several lab studies performed today including a CBC, CMP, iron studies, ferritin, vitamin B12, and folic acid performed today.  The patient's labs show ***  The patient was seen with Dr. Arbutus Ped today.  Recommend that the patient proceed with IV iron infusions with Venofer 300 mg weekly  x3.  We will arrange for this to be performed at the [facility]. infusion center.  The patient was given a prescription for Integra plus to take 1 tablet p.o. daily.  She was instructed to take this with vitamin C which helps with iron absorption.  She was also given a handout of iron rich food and instructed to increase her dietary intake of iron rich food.  Follow-up?  GYN?     PLAN:  The patient voices understanding of current disease status and treatment options and is in agreement with the current care plan.  All questions were answered. The patient knows to call the clinic with any problems, questions or concerns. We can certainly see the patient much sooner if necessary.  Thank you so much for allowing me to participate in the care of Tina Knox. I will continue to follow up the patient with you and assist in her care.  I spent {CHL ONC TIME VISIT - XFGHW:[id number]}  counseling the patient face to face. The total time spent in the appointment was {CHL ONC TIME VISIT - ELFYB:[id number]}.  Disclaimer: This note was dictated with voice recognition software. Similar sounding words can inadvertently be transcribed and may not be corrected upon review.   Tina Knox December 20, 2021, 11:59 AM

## 2021-12-21 ENCOUNTER — Telehealth: Payer: Self-pay | Admitting: Physician Assistant

## 2021-12-21 NOTE — Telephone Encounter (Signed)
Contacted patient to scheduled appointments. Patient is aware of appointments that are scheduled.   

## 2021-12-22 ENCOUNTER — Other Ambulatory Visit: Payer: PRIVATE HEALTH INSURANCE

## 2021-12-22 ENCOUNTER — Encounter: Payer: PRIVATE HEALTH INSURANCE | Admitting: Physician Assistant

## 2021-12-25 ENCOUNTER — Inpatient Hospital Stay: Payer: Self-pay

## 2021-12-25 ENCOUNTER — Inpatient Hospital Stay: Payer: Self-pay | Admitting: Hematology and Oncology

## 2021-12-25 ENCOUNTER — Other Ambulatory Visit: Payer: Self-pay

## 2021-12-25 ENCOUNTER — Inpatient Hospital Stay: Payer: Self-pay | Attending: Physician Assistant | Admitting: Oncology

## 2021-12-25 VITALS — BP 115/72 | HR 99 | Temp 97.8°F | Wt 168.2 lb

## 2021-12-25 DIAGNOSIS — D500 Iron deficiency anemia secondary to blood loss (chronic): Secondary | ICD-10-CM

## 2021-12-25 DIAGNOSIS — N938 Other specified abnormal uterine and vaginal bleeding: Secondary | ICD-10-CM | POA: Insufficient documentation

## 2021-12-25 LAB — VITAMIN B12: Vitamin B-12: 408 pg/mL (ref 180–914)

## 2021-12-25 LAB — CMP (CANCER CENTER ONLY)
ALT: 9 U/L (ref 0–44)
AST: 17 U/L (ref 15–41)
Albumin: 4.2 g/dL (ref 3.5–5.0)
Alkaline Phosphatase: 46 U/L (ref 38–126)
Anion gap: 5 (ref 5–15)
BUN: 9 mg/dL (ref 6–20)
CO2: 28 mmol/L (ref 22–32)
Calcium: 9.5 mg/dL (ref 8.9–10.3)
Chloride: 106 mmol/L (ref 98–111)
Creatinine: 0.79 mg/dL (ref 0.44–1.00)
GFR, Estimated: >60 mL/min (ref >60)
Glucose, Bld: 109 mg/dL — ABNORMAL HIGH (ref 70–99)
Potassium: 3.8 mmol/L (ref 3.5–5.1)
Sodium: 139 mmol/L (ref 135–145)
Total Bilirubin: 0.4 mg/dL (ref 0.3–1.2)
Total Protein: 7.9 g/dL (ref 6.5–8.1)

## 2021-12-25 LAB — CBC WITH DIFFERENTIAL (CANCER CENTER ONLY)
Abs Immature Granulocytes: 0.01 10*3/uL (ref 0.00–0.07)
Basophils Absolute: 0 10*3/uL (ref 0.0–0.1)
Basophils Relative: 0 %
Eosinophils Absolute: 0.1 10*3/uL (ref 0.0–0.5)
Eosinophils Relative: 1 %
HCT: 29.9 % — ABNORMAL LOW (ref 36.0–46.0)
Hemoglobin: 9.1 g/dL — ABNORMAL LOW (ref 12.0–15.0)
Immature Granulocytes: 0 %
Lymphocytes Relative: 41 %
Lymphs Abs: 2.9 10*3/uL (ref 0.7–4.0)
MCH: 21.7 pg — ABNORMAL LOW (ref 26.0–34.0)
MCHC: 30.4 g/dL (ref 30.0–36.0)
MCV: 71.2 fL — ABNORMAL LOW (ref 80.0–100.0)
Monocytes Absolute: 0.5 10*3/uL (ref 0.1–1.0)
Monocytes Relative: 7 %
Neutro Abs: 3.6 10*3/uL (ref 1.7–7.7)
Neutrophils Relative %: 51 %
Platelet Count: 462 10*3/uL — ABNORMAL HIGH (ref 150–400)
RBC: 4.2 MIL/uL (ref 3.87–5.11)
RDW: 20 % — ABNORMAL HIGH (ref 11.5–15.5)
WBC Count: 7.1 10*3/uL (ref 4.0–10.5)
nRBC: 0 % (ref 0.0–0.2)

## 2021-12-25 LAB — PROTIME-INR
INR: 1.1 (ref 0.8–1.2)
Prothrombin Time: 13.6 seconds (ref 11.4–15.2)

## 2021-12-25 LAB — DIRECT ANTIGLOBULIN TEST (NOT AT ARMC)
DAT, IgG: NEGATIVE
DAT, complement: NEGATIVE

## 2021-12-25 LAB — FERRITIN: Ferritin: 3 ng/mL — ABNORMAL LOW (ref 11–307)

## 2021-12-25 LAB — APTT: aPTT: 30 seconds (ref 24–36)

## 2021-12-25 LAB — FIBRINOGEN: Fibrinogen: 348 mg/dL (ref 210–475)

## 2021-12-25 LAB — FOLATE: Folate: 8.9 ng/mL (ref >5.9)

## 2021-12-25 NOTE — Patient Instructions (Signed)
Please take a Flintstones vitamin with iron daily.  Other options are [employer] brand iron which has 25 mg of iron or Solgar gentle iron

## 2021-12-25 NOTE — Progress Notes (Signed)
[facility] Initial Visit:  Patient Care Team: Pcp, No as PCP - General Loni Muse, MD as Attending Physician (Hematology)  CHIEF COMPLAINTS/PURPOSE OF CONSULTATION:  Oncology History   No history exists.    HISTORY OF PRESENTING ILLNESS: Tina Knox 31 y.o. female is here because of  anemia Medical history notable for bilateral salphingectomy  December 25 2021:  [facility] Hematology Consult States that she has been anemic for years.   Patient is G3 P3 with last pregnancy in 2013.  Menopause not reached.  Menses occur month and last 3 to 5 days.  Bleeding is light to heavy.  Does not have bleeding between periods.    Does pass clots.  Last pregnancy was delivered by NSVD.  Patient does not have history of uterine fibroids/uterine abnormalities.  Took PNV during pregnancy but has not taken oral iron alone.  Has not taken IV iron nor required PRBC's in the past.  Tolerated PNV pretty well.  Has have a normal diet.  No history  history of hemorrhage postoperatively requiring transfusion.   No hematochezia, melena, hemoptysis, hematuria.  No history of intra-articular or soft tissue bleeding.  No history of abnormal bleeding in family members.  Patient does not have PICA to ice/starch/dirt.    Review of Systems - Oncology  MEDICAL HISTORY: Past Medical History:  Diagnosis Date   Anemia    with last pregnancy   Trichomonas     SURGICAL HISTORY: Past Surgical History:  Procedure Laterality Date   BILATERAL SALPINGECTOMY  03/02/2012   Procedure: BILATERAL SALPINGECTOMY;  Surgeon: Allie Bossier, MD;  Location: [facility];  Service: Gynecology;  Laterality: Bilateral;   INDUCED ABORTION      SOCIAL HISTORY: Social History   Socioeconomic History   Marital status: Single    Spouse name: Not on file   Number of children: Not on file   Years of education: Not on file   Highest education level: Not on file  Occupational History   Not on file  Tobacco Use    Smoking status: Every Day    Packs/day: 0.25    Types: Cigarettes   Smokeless tobacco: Former    Quit date: 11/05/2011  Vaping Use   Vaping Use: Never used  Substance and Sexual Activity   Alcohol use: No   Drug use: Not Currently    Types: Marijuana   Sexual activity: Yes    Birth control/protection: Surgical  Other Topics Concern   Not on file  Social History Narrative   Not on file   Social Determinants of Health   Financial Resource Strain: Not on file  Food Insecurity: Not on file  Transportation Needs: Not on file  Physical Activity: Not on file  Stress: Not on file  Social Connections: Not on file  Intimate Partner Violence: Not on file    FAMILY HISTORY Family History  Problem Relation Age of Onset   Other Neg Hx     ALLERGIES:  is allergic to metronidazole.  MEDICATIONS:  No current outpatient medications on file.   No current facility-administered medications for this visit.    PHYSICAL EXAMINATION:  ECOG PERFORMANCE STATUS: 0 - Asymptomatic   Vitals:   12/25/21 1530  BP: 115/72  Pulse: 99  Temp: 97.8 F (36.6 C)  SpO2: 100%    Filed Weights   12/25/21 1530  Weight: 168 lb 3.2 oz (76.3 kg)     Physical Exam   LABORATORY DATA: I have personally reviewed  the data as listed:  Appointment on 12/25/2021  Component Date Value Ref Range Status   WBC Count 12/25/2021 7.1  4.0 - 10.5 K/uL Final   RBC 12/25/2021 4.20  3.87 - 5.11 MIL/uL Final   Hemoglobin 12/25/2021 9.1 (L)  12.0 - 15.0 g/dL Final   Comment: Reticulocyte Hemoglobin testing may be clinically indicated, consider ordering this additional test [id number]    HCT 12/25/2021 29.9 (L)  36.0 - 46.0 % Final   MCV 12/25/2021 71.2 (L)  80.0 - 100.0 fL Final   MCH 12/25/2021 21.7 (L)  26.0 - 34.0 pg Final   MCHC 12/25/2021 30.4  30.0 - 36.0 g/dL Final   RDW 99/24/2683 20.0 (H)  11.5 - 15.5 % Final   Platelet Count 12/25/2021 462 (H)  150 - 400 K/uL Final   nRBC 12/25/2021 0.0   0.0 - 0.2 % Final   Neutrophils Relative % 12/25/2021 51  % Final   Neutro Abs 12/25/2021 3.6  1.7 - 7.7 K/uL Final   Lymphocytes Relative 12/25/2021 41  % Final   Lymphs Abs 12/25/2021 2.9  0.7 - 4.0 K/uL Final   Monocytes Relative 12/25/2021 7  % Final   Monocytes Absolute 12/25/2021 0.5  0.1 - 1.0 K/uL Final   Eosinophils Relative 12/25/2021 1  % Final   Eosinophils Absolute 12/25/2021 0.1  0.0 - 0.5 K/uL Final   Basophils Relative 12/25/2021 0  % Final   Basophils Absolute 12/25/2021 0.0  0.0 - 0.1 K/uL Final   Immature Granulocytes 12/25/2021 0  % Final   Abs Immature Granulocytes 12/25/2021 0.01  0.00 - 0.07 K/uL Final   Performed at [facility], [address] [place], [place] [address]  Admission on 11/29/2021, Discharged on 11/29/2021  Component Date Value Ref Range Status   Neisseria Gonorrhea 11/29/2021 Negative   Final   Chlamydia 11/29/2021 Negative   Final   Trichomonas 11/29/2021 Negative   Final   Bacterial Vaginitis (gardnerella) 11/29/2021 Positive (A)   Final   Candida Vaginitis 11/29/2021 Negative   Final   Candida Glabrata 11/29/2021 Negative   Final   Comment 11/29/2021 Normal Reference Range Candida Species - Negative   Final   Comment 11/29/2021 Normal Reference Range Candida Galbrata - Negative   Final   Comment 11/29/2021 Normal Reference Range Trichomonas - Negative   Final   Comment 11/29/2021 Normal Reference Ranger Chlamydia - Negative   Final   Comment 11/29/2021 Normal Reference Range Neisseria Gonorrhea - Negative   Final   Comment 11/29/2021 Normal Reference Range Bacterial Vaginosis - Negative   Final    RADIOGRAPHIC STUDIES: I have personally reviewed the radiological images as listed and agree with the findings in the report  No results found.  ASSESSMENT/PLAN  Patient is a 31 year old female with symptomatic microcytic anemia presumed to be secondary to dysfunctional uterine bleeding  Anemia:  Most likely etiology  is iron deficiency anemia owing to dysfunctional uterine bleeding/ exacerbated by high demand owing to prior pregnancies.  Will obtain CBC with diff, CMP, Ferritin, B12, folate, retic count,  DAT, Haptoglobin  Dysfunctional uterine bleeding:  This is characterized by menses that are prolonged, irregular as well as by heavy bleeding with passage of clots.  Will evaluate for possible bleeding disorder with PT, PTT, Fibrinogen, von Willebrand screen.  Refer to gynecology for evaluation and management   Therapeutics:  Patient has anemia with Hgb < 10  and has not tolerated oral iron.  Have instructed her  to begin a daily Flintstones MVI with plans to follow up in 4 to 6 weeks.  If no improvement or if she becomes systematic will arrange for IV iron replacement.    Patient in agreement with the plan.     Cancer Staging  No matching staging information was found for the patient.   No problem-specific Assessment & Plan notes found for this encounter.   Orders Placed This Encounter  Procedures   CBC with Differential ([facility] Only)    Standing Status:   Future    Number of Occurrences:   1    Standing Expiration Date:   12/26/2022   CMP ([facility] only)    Standing Status:   Future    Number of Occurrences:   1    Standing Expiration Date:   12/26/2022   Ferritin    Standing Status:   Future    Number of Occurrences:   1    Standing Expiration Date:   12/26/2022   Folate    Standing Status:   Future    Number of Occurrences:   1    Standing Expiration Date:   12/26/2022   Hgb Fractionation Cascade    Standing Status:   Future    Number of Occurrences:   1    Standing Expiration Date:   12/26/2022   Vitamin B12    Standing Status:   Future    Number of Occurrences:   1    Standing Expiration Date:   12/26/2022   Von Willebrand panel    Standing Status:   Future    Number of Occurrences:   1    Standing Expiration Date:   12/26/2022   Fibrinogen    Standing Status:   Future     Number of Occurrences:   1    Standing Expiration Date:   12/26/2022   Protime-INR    Standing Status:   Future    Number of Occurrences:   1    Standing Expiration Date:   12/26/2022   APTT    Standing Status:   Future    Number of Occurrences:   1    Standing Expiration Date:   12/25/2022   Direct antiglobulin test (not at [facility])    Standing Status:   Future    Number of Occurrences:   1    Standing Expiration Date:   12/26/2022    All questions were answered. The patient knows to call the clinic with any problems, questions or concerns.  This note was electronically signed.    Barbee Cough, MD  12/25/2021 4:15 PM

## 2021-12-26 LAB — VON WILLEBRAND PANEL
Coagulation Factor VIII: 152 % — ABNORMAL HIGH (ref 56–140)
Ristocetin Co-factor, Plasma: 57 % (ref 50–200)
Von Willebrand Antigen, Plasma: 138 % (ref 50–200)

## 2021-12-26 LAB — COAG STUDIES INTERP REPORT

## 2021-12-29 LAB — HGB FRACTIONATION CASCADE
Hgb A2: 2.5 % (ref 1.8–3.2)
Hgb A: 97.5 % (ref 96.4–98.8)
Hgb F: 0 % (ref 0.0–2.0)
Hgb S: 0 %

## 2022-01-04 DIAGNOSIS — N938 Other specified abnormal uterine and vaginal bleeding: Secondary | ICD-10-CM | POA: Insufficient documentation

## 2022-01-29 ENCOUNTER — Other Ambulatory Visit: Payer: Self-pay

## 2022-01-29 ENCOUNTER — Inpatient Hospital Stay: Payer: Self-pay | Attending: Physician Assistant | Admitting: Oncology

## 2022-01-29 ENCOUNTER — Inpatient Hospital Stay: Payer: Self-pay

## 2022-01-29 VITALS — BP 118/72 | HR 88 | Temp 98.2°F | Resp 16 | Wt 167.2 lb

## 2022-01-29 DIAGNOSIS — N938 Other specified abnormal uterine and vaginal bleeding: Secondary | ICD-10-CM | POA: Insufficient documentation

## 2022-01-29 DIAGNOSIS — D500 Iron deficiency anemia secondary to blood loss (chronic): Secondary | ICD-10-CM | POA: Insufficient documentation

## 2022-01-29 LAB — CBC WITH DIFFERENTIAL (CANCER CENTER ONLY)
Abs Immature Granulocytes: 0.03 10*3/uL (ref 0.00–0.07)
Basophils Absolute: 0 10*3/uL (ref 0.0–0.1)
Basophils Relative: 1 %
Eosinophils Absolute: 0.1 10*3/uL (ref 0.0–0.5)
Eosinophils Relative: 1 %
HCT: 31.5 % — ABNORMAL LOW (ref 36.0–46.0)
Hemoglobin: 9.8 g/dL — ABNORMAL LOW (ref 12.0–15.0)
Immature Granulocytes: 1 %
Lymphocytes Relative: 40 %
Lymphs Abs: 2.6 10*3/uL (ref 0.7–4.0)
MCH: 22.4 pg — ABNORMAL LOW (ref 26.0–34.0)
MCHC: 31.1 g/dL (ref 30.0–36.0)
MCV: 72.1 fL — ABNORMAL LOW (ref 80.0–100.0)
Monocytes Absolute: 0.6 10*3/uL (ref 0.1–1.0)
Monocytes Relative: 9 %
Neutro Abs: 3.3 10*3/uL (ref 1.7–7.7)
Neutrophils Relative %: 48 %
Platelet Count: 383 10*3/uL (ref 150–400)
RBC: 4.37 MIL/uL (ref 3.87–5.11)
RDW: 21.6 % — ABNORMAL HIGH (ref 11.5–15.5)
WBC Count: 6.5 10*3/uL (ref 4.0–10.5)
nRBC: 0 % (ref 0.0–0.2)

## 2022-01-29 LAB — FERRITIN: Ferritin: 5 ng/mL — ABNORMAL LOW (ref 11–307)

## 2022-01-29 NOTE — Progress Notes (Signed)
[facility] Cancer Initial Visit:  Patient Care Team: Pcp, No as PCP - General Loni Muse, MD as Attending Physician (Hematology)  CHIEF COMPLAINTS/PURPOSE OF CONSULTATION:  Oncology History   No history exists.    HISTORY OF PRESENTING ILLNESS: Tina Knox 31 y.o. female is here because of  anemia Medical history notable for bilateral salphingectomy  December 25 2021:  [facility] States that she has been anemic for years.   Patient is G3 P3 with last pregnancy in 2013.  Menopause not reached.  Menses occur month and last 3 to 5 days.  Bleeding is light to heavy.  Does not have bleeding between periods.    Does pass clots.  Last pregnancy was delivered by NSVD.  Patient does not have history of uterine fibroids/uterine abnormalities.  Took PNV during pregnancy but has not taken oral iron alone.  Has not taken IV iron nor required PRBC's in the past.  Tolerated PNV pretty well.  Has have a normal diet.  No history  history of hemorrhage postoperatively requiring transfusion.   No hematochezia, melena, hemoptysis, hematuria.  No history of intra-articular or soft tissue bleeding.  No history of abnormal bleeding in family members.  Patient does not have PICA to ice/starch/dirt.    WBC 7.1 hemoglobin 9.1 MCV 71 platelet count 462; 51 segs 41 lymphs 7 monos 1 EO Hemoglobin electrophoresis showed normal adult pattern. Coombs test negative INR 1.1 PTT 30 fibrinogen 348 Factor VIII 152 ristocetin cofactor 57 1 von Willebrand factor antigen 138 CMP notable for glucose of 109 Ferritin 3 folate 8.9 B12 408  January 29 2022:  Scheduled follow up for management of anemia.  Reviewed results of labs with patient.  She is taking a MVI with iron and tolerating it well.    Review of Systems - Oncology  MEDICAL HISTORY: Past Medical History:  Diagnosis Date   Anemia    with last pregnancy   Trichomonas     SURGICAL HISTORY: Past Surgical History:   Procedure Laterality Date   BILATERAL SALPINGECTOMY  03/02/2012   Procedure: BILATERAL SALPINGECTOMY;  Surgeon: Allie Bossier, MD;  Location: [facility];  Service: Gynecology;  Laterality: Bilateral;   INDUCED ABORTION      SOCIAL HISTORY: Social History   Socioeconomic History   Marital status: Single    Spouse name: Not on file   Number of children: Not on file   Years of education: Not on file   Highest education level: Not on file  Occupational History   Not on file  Tobacco Use   Smoking status: Every Day    Packs/day: 0.25    Types: Cigarettes   Smokeless tobacco: Former    Quit date: 11/05/2011  Vaping Use   Vaping Use: Never used  Substance and Sexual Activity   Alcohol use: No   Drug use: Not Currently    Types: Marijuana   Sexual activity: Yes    Birth control/protection: Surgical  Other Topics Concern   Not on file  Social History Narrative   Not on file   Social Determinants of Health   Financial Resource Strain: Not on file  Food Insecurity: Not on file  Transportation Needs: Not on file  Physical Activity: Not on file  Stress: Not on file  Social Connections: Not on file  Intimate Partner Violence: Not on file    FAMILY HISTORY Family History  Problem Relation Age of Onset   Other Neg Hx  ALLERGIES:  is allergic to metronidazole.  MEDICATIONS:  No current outpatient medications on file.   No current facility-administered medications for this visit.    PHYSICAL EXAMINATION:  ECOG PERFORMANCE STATUS: 0 - Asymptomatic   Vitals:   01/29/22 1414  BP: 118/72  Pulse: 88  Resp: 16  Temp: 98.2 F (36.8 C)  SpO2: 100%    Filed Weights   01/29/22 1414  Weight: 167 lb 3.2 oz (75.8 kg)     Physical Exam   LABORATORY DATA: I have personally reviewed the data as listed:  No visits with results within 1 Month(s) from this visit.  Latest known visit with results is:  Clinical Support on 12/25/2021  Component Date Value Ref Range  Status   Prothrombin Time 12/25/2021 13.6  11.4 - 15.2 seconds Final   INR 12/25/2021 1.1  0.8 - 1.2 Final   Comment: (NOTE) INR goal varies based on device and disease states. Performed at [facility], [address] [place], [place] [address]    Fibrinogen 12/25/2021 348  210 - 475 mg/dL Final   Comment: (NOTE) Fibrinogen results may be underestimated in patients receiving thrombolytic therapy. Performed at [facility], [address] [place], [place] [address]    Coagulation Factor VIII 12/25/2021 152 (H)  56 - 140 % Final   Ristocetin Co-factor, Plasma 12/25/2021 57  50 - 200 % Final   Comment: (NOTE) Performed At: [facility] [address] [place], [place] [id number] Jolene Schimke MD XT:[id number]    Von Willebrand Antigen, Plasma 12/25/2021 138  50 - 200 % Final   Comment: (NOTE) This test was developed and its performance characteristics determined by [facility]. It has not been cleared or approved by the Food and Drug Administration.    Vitamin B-12 12/25/2021 408  180 - 914 pg/mL Final   Comment: (NOTE) This assay is not validated for testing neonatal or myeloproliferative syndrome specimens for Vitamin B12 levels. Performed at [facility], [address] [place], [place] [address]    Hgb F 12/25/2021 0.0  0.0 - 2.0 % Final   Hgb A 12/25/2021 97.5  96.4 - 98.8 % Final   Hgb A2 12/25/2021 2.5  1.8 - 3.2 % Final   Hgb S 12/25/2021 0.0  0.0 % Final   Interpretation, Hgb Fract 12/25/2021 Comment   Final   Comment: (NOTE) Normal hemoglobin present; no hemoglobin variant or beta thalassemia identified. Note: Alpha thalassemia may not be detected by the Hgb Fractionation Cascade panel. If alpha thalassemia is suspected, [facility] offers Alpha-Thalassemia DNA Analysis [phone]). Performed At: [facility] [address] [place], [place] [id number] Jolene Schimke MD QJ:[id number]    Folate  12/25/2021 8.9  >5.9 ng/mL Final   Performed at [facility], [address] [place], [place] [address]   Ferritin 12/25/2021 3 (L)  11 - 307 ng/mL Final   Performed at [facility], [address], [place], [place] [address]   DAT, complement 12/25/2021 NEG   Final   DAT, IgG 12/25/2021    Final                   Value:NEG Performed at [facility], [address] [place], [place] [address]    Sodium 12/25/2021 139  135 - 145 mmol/L Final   Potassium 12/25/2021 3.8  3.5 - 5.1 mmol/L Final   Chloride 12/25/2021 106  98 - 111 mmol/L Final   CO2  12/25/2021 28  22 - 32 mmol/L Final   Glucose, Bld 12/25/2021 109 (H)  70 - 99 mg/dL Final   Glucose reference range applies only to samples taken after fasting for at least 8 hours.   BUN 12/25/2021 9  6 - 20 mg/dL Final   Creatinine 95/28/4132 0.79  0.44 - 1.00 mg/dL Final   Calcium 44/03/270 9.5  8.9 - 10.3 mg/dL Final   Total Protein 53/66/4403 7.9  6.5 - 8.1 g/dL Final   Albumin 47/42/5956 4.2  3.5 - 5.0 g/dL Final   AST 38/75/6433 17  15 - 41 U/L Final   ALT 12/25/2021 9  0 - 44 U/L Final   Alkaline Phosphatase 12/25/2021 46  38 - 126 U/L Final   Total Bilirubin 12/25/2021 0.4  0.3 - 1.2 mg/dL Final   GFR, Estimated 12/25/2021 >60  >60 mL/min Final   Comment: (NOTE) Calculated using the CKD-EPI Creatinine Equation (2021)    Anion gap 12/25/2021 5  5 - 15 Final   Performed at [facility], [address] [place], [place] [address]   WBC Count 12/25/2021 7.1  4.0 - 10.5 K/uL Final   RBC 12/25/2021 4.20  3.87 - 5.11 MIL/uL Final   Hemoglobin 12/25/2021 9.1 (L)  12.0 - 15.0 g/dL Final   Comment: Reticulocyte Hemoglobin testing may be clinically indicated, consider ordering this additional test [id number]    HCT 12/25/2021 29.9 (L)  36.0 - 46.0 % Final   MCV 12/25/2021 71.2 (L)  80.0 - 100.0 fL Final   MCH 12/25/2021 21.7 (L)  26.0 - 34.0 pg Final    MCHC 12/25/2021 30.4  30.0 - 36.0 g/dL Final   RDW 01/60/1093 20.0 (H)  11.5 - 15.5 % Final   Platelet Count 12/25/2021 462 (H)  150 - 400 K/uL Final   nRBC 12/25/2021 0.0  0.0 - 0.2 % Final   Neutrophils Relative % 12/25/2021 51  % Final   Neutro Abs 12/25/2021 3.6  1.7 - 7.7 K/uL Final   Lymphocytes Relative 12/25/2021 41  % Final   Lymphs Abs 12/25/2021 2.9  0.7 - 4.0 K/uL Final   Monocytes Relative 12/25/2021 7  % Final   Monocytes Absolute 12/25/2021 0.5  0.1 - 1.0 K/uL Final   Eosinophils Relative 12/25/2021 1  % Final   Eosinophils Absolute 12/25/2021 0.1  0.0 - 0.5 K/uL Final   Basophils Relative 12/25/2021 0  % Final   Basophils Absolute 12/25/2021 0.0  0.0 - 0.1 K/uL Final   Immature Granulocytes 12/25/2021 0  % Final   Abs Immature Granulocytes 12/25/2021 0.01  0.00 - 0.07 K/uL Final   Performed at [facility], [address] [place], [place] [address]   aPTT 12/25/2021 30  24 - 36 seconds Final   Performed at [facility], [address], [place], [place] [address]   Interpretation 12/25/2021 Note   Final   Comment: (NOTE) ------------------------------- COAGULATION: VON WILLEBRAND FACTOR ASSESSMENT CURRENT RESULTS ASSESSMENT The VWF:Ag is normal. The VWF:RCo is normal. The FVIII is elevated. VON WILLEBRAND FACTOR ASSESSMENT CURRENT RESULTS INTERPRETATION - These results are not consistent with a diagnosis of VWD according to the current NHLBI guideline. Persistently elevated FVIII activity is a risk factor for venous thrombosis as well as recurrence of venous thrombosis. Risk is graded and increases with the degree of elevation. Although elevated FVIII activity has been identified to cluster within families, a genetic basis for the elevation has not yet  been elucidated (Br J Haematol. 2012; 157(6):653-663). VON WILLEBRAND FACTOR ASSESSMENT - Results may be falsely elevated and possibly falsely normal as VWF and FVIII  may increase in pregnancy, in samples drawn from patients (particularly children) who are visibly stressed at the time of phlebotomy, as acute phase reactants                          , or in response to certain drug therapies such as desmopressin. Repeat testing may be necessary before excluding a diagnosis of VWD especially if the clinical suspicion is high for an underlying bleeding disorder. The setting for phlebotomy should be as calm as possible and patients should be encouraged to sit quietly prior to the blood draw. VON WILLEBRAND FACTOR ASSESSMENT DEFINITIONS - VWD - von Willebrand disease; VWF - von Willebrand factor; VWF:Ag - VWF antigen; VWF:RCo - VWF ristocetin cofactor activity; FVIII - factor VIII activity. MEDICAL DIRECTOR: For questions regarding panel interpretation, please contact Lebron Conners, M.D. at [facility] at [phone]. ------------------------------- DISCLAIMER These assessments and interpretations are provided as a convenience in support of the physician-patient relationship and are not intended to replace the physician's clinical judgment. They are derived from national guidelines in addition                           to other evidence and expert opinion. The clinician should consider this information within the context of clinical opinion and the individual patient. SEE GUIDANCE FOR VON WILLEBRAND FACTOR ASSESSMENT: (1) The National Heart, Lung and Blood Institute. The Diagnosis, Evaluation and Management of von Willebrand Disease. Lafe Garin, MD: [employer] Publication 2167513001. 2007. Available at http://kemp.com/. (2) Annie Sable et al. Othella Boyer J Hematol. 2009; 84(6):366-370. (3) Laffan M et al. Haemophilia. 2004;10(3):199-217. (4) Pasi KJ et al. Haemophilia. 2004; 10(3):218-231. Performed At: [facility] / Digital [address] [place], [place] [id number] Blanchie Serve MD  QV:[id number]     RADIOGRAPHIC STUDIES: I have personally reviewed the radiological images as listed and agree with the findings in the report  No results found.  ASSESSMENT/PLAN  Patient is a 31 year old female with symptomatic microcytic anemia presumed to be secondary to dysfunctional uterine bleeding  Anemia:  Secondary to iron deficiency anemia owing to dysfunctional uterine bleeding/ exacerbated by high demand owing to prior pregnancies.    Dysfunctional uterine bleeding:  This is characterized by menses that are prolonged, irregular as well as by heavy bleeding with passage of clots.  Screen for possible bleeding disorder with PT, PTT, Fibrinogen, von Willebrand profile negative.  Management per PCP/ Gynecology.     Therapeutics:  Patient has mild anemia.  Tolerating a daily Flintstones MVI.  Will check CBC with diff and ferritin today.  Follow up in 3 to 4 months.       Cancer Staging  No matching staging information was found for the patient.   No problem-specific Assessment & Plan notes found for this encounter.   No orders of the defined types were placed in this encounter.   All questions were answered. The patient knows to call the clinic with any problems, questions or concerns.  This note was electronically signed.    Loni Muse, MD  01/29/2022 2:20 PM

## 2022-01-29 NOTE — Progress Notes (Signed)
Per Dr. Angelene Giovanni, pt will have labs today and will f/u in 3 months with him.

## 2022-01-31 LAB — HAPTOGLOBIN: Haptoglobin: 92 mg/dL (ref 33–278)

## 2022-02-01 ENCOUNTER — Telehealth: Payer: Self-pay | Admitting: Oncology

## 2022-02-01 NOTE — Telephone Encounter (Signed)
Spoke with patient regarding upcoming appointments  

## 2022-04-07 ENCOUNTER — Encounter (HOSPITAL_COMMUNITY): Payer: Self-pay

## 2022-04-07 ENCOUNTER — Ambulatory Visit (HOSPITAL_COMMUNITY)
Admission: EM | Admit: 2022-04-07 | Discharge: 2022-04-07 | Disposition: A | Discharge status: Home / Self Care | Payer: Self-pay | Attending: Sports Medicine | Admitting: Sports Medicine

## 2022-04-07 DIAGNOSIS — N898 Other specified noninflammatory disorders of vagina: Secondary | ICD-10-CM | POA: Insufficient documentation

## 2022-04-07 MED ORDER — METRONIDAZOLE 0.75 % VA GEL: 1.0000 | Freq: Every day | VAGINAL | 0 refills | Status: AC

## 2022-04-07 NOTE — ED Provider Notes (Signed)
[place]   [id number] 04/07/22 Arrival Time: 0801  ASSESSMENT & PLAN:  1. Vaginal discharge    -White/brown vaginal discharge for 4 days with odor.  History of recurrent BV.  Will treat empirically as if this is BV with metronidazole gel, per patient preference.  Self swab is pending.  Patient declined takiong a pregnancy test today.  She also declined further STD testing by blood draws today.  We will call her if any of her results are positive.  All questions were answered and she agrees to plan.  Meds ordered this encounter  Medications   metroNIDAZOLE (METROGEL) 0.75 % vaginal gel    Sig: Place 1 Applicatorful vaginally at bedtime for 5 days.    Dispense:  50 g    Refill:  0     Discharge Instructions      Apply one applicator at bed time for 5 nights        Reviewed expectations re: course of current medical issues. Questions answered. Outlined signs and symptoms indicating need for more acute intervention. Patient verbalized understanding. After Visit Summary given.   SUBJECTIVE:  Pleasant 32 year old female history of salpingectomy bilaterally comes to urgent care to be evaluated for vaginal discharge.  She has had 4 days of white/brown vaginal discharge with odor.  She has a history of recurrent BV.  Her last BV episode was in September 2023.  She says that she usually gets better with a metronidazole gel applicator.  She also like STD testing today.  She had a full STD blood panel done in February 2023.  She would not like any repeat blood testing today.  She would like to do self swab.  She has been sexually active with 1 female partner.  They have been having unprotected sex.  She just finished her period 3 days ago.  She declines a pregnancy test today.  Please note she does have history of bilateral salpingectomy done 11 years ago.  Denies any abdominal pain, fevers, nausea or vomiting.  Patient's last menstrual period was 04/04/2022. Past  Surgical History:  Procedure Laterality Date   BILATERAL SALPINGECTOMY  03/02/2012   Procedure: BILATERAL SALPINGECTOMY;  Surgeon: Emily Filbert, MD;  Location: [facility];  Service: Gynecology;  Laterality: Bilateral;   INDUCED ABORTION       OBJECTIVE:  Vitals:   04/07/22 0822  BP: 112/71  Pulse: 80  Resp: 12  Temp: 98.7 F (37.1 C)  TempSrc: Oral  SpO2: 98%     Physical Exam Vitals and nursing note reviewed.  Constitutional:      General: She is not in acute distress.    Appearance: She is not toxic-appearing.  HENT:     Head: Normocephalic.  Cardiovascular:     Rate and Rhythm: Normal rate and regular rhythm.     Pulses: Normal pulses.     Heart sounds: Normal heart sounds.  Pulmonary:     Effort: Pulmonary effort is normal.     Breath sounds: Normal breath sounds.  Abdominal:     General: Abdomen is flat. Bowel sounds are normal. There is no distension.     Palpations: Abdomen is soft.     Tenderness: There is no abdominal tenderness. There is no guarding or rebound.  Musculoskeletal:        General: Normal range of motion.  Skin:    General: Skin is warm.  Neurological:     General: No focal deficit present.  Psychiatric:  Mood and Affect: Mood normal.      Labs: Results for orders placed or performed in visit on 01/29/22  Haptoglobin  Result Value Ref Range   Haptoglobin 92 33 - 278 mg/dL  Ferritin  Result Value Ref Range   Ferritin 5 (L) 11 - 307 ng/mL  CBC with Differential (Cancer Center Only)  Result Value Ref Range   WBC Count 6.5 4.0 - 10.5 K/uL   RBC 4.37 3.87 - 5.11 MIL/uL   Hemoglobin 9.8 (L) 12.0 - 15.0 g/dL   HCT 31.5 (L) 36.0 - 46.0 %   MCV 72.1 (L) 80.0 - 100.0 fL   MCH 22.4 (L) 26.0 - 34.0 pg   MCHC 31.1 30.0 - 36.0 g/dL   RDW 21.6 (H) 11.5 - 15.5 %   Platelet Count 383 150 - 400 K/uL   nRBC 0.0 0.0 - 0.2 %   Neutrophils Relative % 48 %   Neutro Abs 3.3 1.7 - 7.7 K/uL   Lymphocytes Relative 40 %   Lymphs Abs 2.6 0.7 - 4.0  K/uL   Monocytes Relative 9 %   Monocytes Absolute 0.6 0.1 - 1.0 K/uL   Eosinophils Relative 1 %   Eosinophils Absolute 0.1 0.0 - 0.5 K/uL   Basophils Relative 1 %   Basophils Absolute 0.0 0.0 - 0.1 K/uL   Immature Granulocytes 1 %   Abs Immature Granulocytes 0.03 0.00 - 0.07 K/uL   Labs Reviewed  CERVICOVAGINAL ANCILLARY ONLY    Imaging: No results found.   Allergies  Allergen Reactions   Metronidazole Nausea And Vomiting    Patient prefers the gel                                               Past Medical History:  Diagnosis Date   Anemia    with last pregnancy   Trichomonas     Social History   Socioeconomic History   Marital status: Single    Spouse name: Not on file   Number of children: Not on file   Years of education: Not on file   Highest education level: Not on file  Occupational History   Not on file  Tobacco Use   Smoking status: Every Day    Packs/day: 0.25    Types: Cigarettes   Smokeless tobacco: Former    Quit date: 11/05/2011  Vaping Use   Vaping Use: Never used  Substance and Sexual Activity   Alcohol use: No   Drug use: Not Currently    Types: Marijuana   Sexual activity: Yes    Birth control/protection: Surgical  Other Topics Concern   Not on file  Social History Narrative   Not on file   Social Determinants of Health   Financial Resource Strain: Not on file  Food Insecurity: Not on file  Transportation Needs: Not on file  Physical Activity: Not on file  Stress: Not on file  Social Connections: Not on file  Intimate Partner Violence: Not on file    Family History  Problem Relation Age of Onset   Other Neg Hx       Latrish Mogel, Dorian Pod, MD 04/07/22 [phone]

## 2022-04-07 NOTE — ED Triage Notes (Signed)
Pt is here for STD check, pt states she has vaginal itching, brown discharge w/ odor x4days

## 2022-04-07 NOTE — Discharge Instructions (Addendum)
Apply one applicator at bed time for 5 nights

## 2022-04-08 LAB — CERVICOVAGINAL ANCILLARY ONLY
Bacterial Vaginitis (gardnerella): POSITIVE — AB
Candida Glabrata: NEGATIVE
Candida Vaginitis: NEGATIVE
Chlamydia: NEGATIVE
Comment: NEGATIVE
Comment: NEGATIVE
Comment: NEGATIVE
Comment: NEGATIVE
Comment: NEGATIVE
Comment: NORMAL
Neisseria Gonorrhea: NEGATIVE
Trichomonas: NEGATIVE

## 2022-05-07 ENCOUNTER — Inpatient Hospital Stay: Payer: Self-pay | Attending: Oncology | Admitting: Oncology

## 2022-05-07 ENCOUNTER — Inpatient Hospital Stay: Payer: Self-pay

## 2022-05-07 NOTE — Progress Notes (Deleted)
[facility] Follow up Visit:  Patient Care Team: Pcp, No as PCP - General Arienne Gartin, Gregary Signs, MD as Attending Physician (Hematology)  CHIEF COMPLAINTS/PURPOSE OF CONSULTATION:  HISTORY OF PRESENTING ILLNESS: Tina Knox 32 y.o. female is here because of  anemia Medical history notable for bilateral salphingectomy  December 25 2021:  [facility] Hematology Consult States that she has been anemic for years.   Patient is G3 P3 with last pregnancy in 2013.  Menopause not reached.  Menses occur month and last 3 to 5 days.  Bleeding is light to heavy.  Does not have bleeding between periods.    Does pass clots.  Last pregnancy was delivered by NSVD.  Patient does not have history of uterine fibroids/uterine abnormalities.  Took PNV during pregnancy but has not taken oral iron alone.  Has not taken IV iron nor required PRBC's in the past.  Tolerated PNV pretty well.  Has have a normal diet.  No history  history of hemorrhage postoperatively requiring transfusion.   No hematochezia, melena, hemoptysis, hematuria.  No history of intra-articular or soft tissue bleeding.  No history of abnormal bleeding in family members.  Patient does not have PICA to ice/starch/dirt.    WBC 7.1 hemoglobin 9.1 MCV 71 platelet count 462; 51 segs 41 lymphs 7 monos 1 EO Hemoglobin electrophoresis showed normal adult pattern. Coombs test negative INR 1.1 PTT 30 fibrinogen 348 Factor VIII 152 ristocetin cofactor 57 1 von Willebrand factor antigen 138 CMP notable for glucose of 109 Ferritin 3 folate 8.9 B12 408  January 29 2022:  Reviewed results of labs with patient.  She is taking a MVI with iron and tolerating it well.   WBC 6.5 hemoglobin 9.8 MCV 72 platelet count 383 Ferritin 5  May 07, 2022: Scheduled follow up for management of anemia.    Review of Systems - Oncology  MEDICAL HISTORY: Past Medical History:  Diagnosis Date   Anemia    with last pregnancy   Trichomonas      SURGICAL HISTORY: Past Surgical History:  Procedure Laterality Date   BILATERAL SALPINGECTOMY  03/02/2012   Procedure: BILATERAL SALPINGECTOMY;  Surgeon: Emily Filbert, MD;  Location: [facility];  Service: Gynecology;  Laterality: Bilateral;   INDUCED ABORTION      SOCIAL HISTORY: Social History   Socioeconomic History   Marital status: Single    Spouse name: Not on file   Number of children: Not on file   Years of education: Not on file   Highest education level: Not on file  Occupational History   Not on file  Tobacco Use   Smoking status: Every Day    Packs/day: 0.25    Types: Cigarettes   Smokeless tobacco: Former    Quit date: 11/05/2011  Vaping Use   Vaping Use: Never used  Substance and Sexual Activity   Alcohol use: No   Drug use: Not Currently    Types: Marijuana   Sexual activity: Yes    Birth control/protection: Surgical  Other Topics Concern   Not on file  Social History Narrative   Not on file   Social Determinants of Health   Financial Resource Strain: Not on file  Food Insecurity: Not on file  Transportation Needs: Not on file  Physical Activity: Not on file  Stress: Not on file  Social Connections: Not on file  Intimate Partner Violence: Not on file    FAMILY HISTORY Family History  Problem Relation Age of Onset  Other Neg Hx     ALLERGIES:  is allergic to metronidazole.  MEDICATIONS:  No current outpatient medications on file.   No current facility-administered medications for this visit.    PHYSICAL EXAMINATION:  ECOG PERFORMANCE STATUS: 0 - Asymptomatic   There were no vitals filed for this visit.   There were no vitals filed for this visit.    Physical Exam   LABORATORY DATA: I have personally reviewed the data as listed:  Admission on 04/07/2022, Discharged on 04/07/2022  Component Date Value Ref Range Status   Bacterial Vaginitis (gardnerella) 04/07/2022 Positive (A)   Final   Candida Vaginitis 04/07/2022  Negative   Final   Candida Glabrata 04/07/2022 Negative   Final   Trichomonas 04/07/2022 Negative   Final   Chlamydia 04/07/2022 Negative   Final   Neisseria Gonorrhea 04/07/2022 Negative   Final   Comment 04/07/2022 Normal Reference Range Bacterial Vaginosis - Negative   Final   Comment 04/07/2022 Normal Reference Range Candida Species - Negative   Final   Comment 04/07/2022 Normal Reference Range Candida Galbrata - Negative   Final   Comment 04/07/2022 Normal Reference Range Trichomonas - Negative   Final   Comment 04/07/2022 Normal Reference Ranger Chlamydia - Negative   Final   Comment 04/07/2022 Normal Reference Range Neisseria Gonorrhea - Negative   Final    RADIOGRAPHIC STUDIES: I have personally reviewed the radiological images as listed and agree with the findings in the report  No results found.  ASSESSMENT/PLAN  Patient is a 32 year old female with symptomatic microcytic anemia presumed to be secondary to dysfunctional uterine bleeding  Anemia:  Secondary to iron deficiency anemia owing to dysfunctional uterine bleeding/ exacerbated by high demand owing to prior pregnancies.    Dysfunctional uterine bleeding:  This is characterized by menses that are prolonged, irregular as well as by heavy bleeding with passage of clots.  Screen for possible bleeding disorder with PT, PTT, Fibrinogen, von Willebrand profile negative.  Management per PCP/ Gynecology.     Therapeutics:  Patient has mild anemia.  Tolerating a daily Flintstones MVI.  Will check CBC with diff and ferritin today.  Follow up in 3 to 4 months.       Cancer Staging  No matching staging information was found for the patient.   No problem-specific Assessment & Plan notes found for this encounter.   No orders of the defined types were placed in this encounter.   All questions were answered. The patient knows to call the clinic with any problems, questions or concerns.  This note was electronically signed.     Barbee Cough, MD  05/07/2022 1:28 PM

## 2022-07-21 ENCOUNTER — Other Ambulatory Visit: Payer: Self-pay

## 2022-07-21 ENCOUNTER — Ambulatory Visit (HOSPITAL_COMMUNITY)
Admission: EM | Admit: 2022-07-21 | Discharge: 2022-07-21 | Disposition: A | Discharge status: Home / Self Care | Payer: Self-pay | Attending: Emergency Medicine | Admitting: Emergency Medicine

## 2022-07-21 ENCOUNTER — Encounter (HOSPITAL_COMMUNITY): Payer: Self-pay | Admitting: Emergency Medicine

## 2022-07-21 DIAGNOSIS — N390 Urinary tract infection, site not specified: Secondary | ICD-10-CM

## 2022-07-21 DIAGNOSIS — R319 Hematuria, unspecified: Secondary | ICD-10-CM

## 2022-07-21 LAB — POCT URINALYSIS DIP (MANUAL ENTRY)
Bilirubin, UA: NEGATIVE
Glucose, UA: NEGATIVE mg/dL
Nitrite, UA: NEGATIVE
Protein Ur, POC: 100 mg/dL — AB
Spec Grav, UA: 1.025 (ref 1.010–1.025)
Urobilinogen, UA: 0.2 E.U./dL
pH, UA: 6 (ref 5.0–8.0)

## 2022-07-21 LAB — POCT URINE PREGNANCY: Preg Test, Ur: NEGATIVE

## 2022-07-21 MED ORDER — NITROFURANTOIN MONOHYD MACRO 100 MG PO CAPS: 100.0000 mg | ORAL_CAPSULE | Freq: Two times a day (BID) | ORAL | 0 refills | Status: AC

## 2022-07-21 MED ORDER — PHENAZOPYRIDINE HCL 100 MG PO TABS: 100.0000 mg | ORAL_TABLET | Freq: Three times a day (TID) | ORAL | 0 refills | Status: DC | PRN

## 2022-07-21 NOTE — ED Provider Notes (Signed)
[facility]    CSN: [id number] Arrival date & time: 07/21/22  1939      History   Chief Complaint Chief Complaint  Patient presents with   Urinary Tract Infection    HPI Tina Knox is a 32 y.o. female.   HPI  Concern for UTI  Onset: sudden  Duration: started 5 days ago   Associated symptoms: cloudy urine, dysuria, urinary frequency, sensation of incomplete voiding  She reports pain at the end of urination. She reports some urinary hesitancy.   Interventions: none   She reports her LMP ended yesterday    Past Medical History:  Diagnosis Date   Anemia    with last pregnancy   Trichomonas     Patient Active Problem List   Diagnosis Date Noted   Dysfunctional uterine bleeding 01/04/2022   Prediabetes 09/19/2021   Marijuana smoker 01/26/2011   Anemia 01/12/2011    Past Surgical History:  Procedure Laterality Date   BILATERAL SALPINGECTOMY  03/02/2012   Procedure: BILATERAL SALPINGECTOMY;  Surgeon: Allie Bossier, MD;  Location: [facility];  Service: Gynecology;  Laterality: Bilateral;   INDUCED ABORTION      OB History     Gravida  4   Para  3   Term  3   Preterm      AB  1   Living  3      SAB      IAB  1   Ectopic      Multiple      Live Births  3            Home Medications    Prior to Admission medications   Medication Sig Start Date End Date Taking? Authorizing Provider  NON FORMULARY URO probiotics for women   Yes [provider]    Family History Family History  Problem Relation Age of Onset   Other Neg Hx     Social History Social History   Tobacco Use   Smoking status: Every Day    Packs/day: .25    Types: Cigarettes   Smokeless tobacco: Former    Quit date: 11/05/2011  Vaping Use   Vaping Use: Never used  Substance Use Topics   Alcohol use: No   Drug use: Not Currently    Types: Marijuana     Allergies   Metronidazole   Review of Systems Review of Systems  Constitutional:   Negative for chills, fatigue and fever.  Gastrointestinal:  Negative for abdominal pain, diarrhea, nausea and vomiting.  Genitourinary:  Positive for difficulty urinating, dysuria, frequency and vaginal bleeding (she was on her menstrual period up to yesterday). Negative for flank pain, vaginal discharge and vaginal pain.     Physical Exam Triage Vital Signs ED Triage Vitals [07/21/22 1958]  Enc Vitals Group     BP      Pulse      Resp      Temp      Temp src      SpO2      Weight      Height      Head Circumference      Peak Flow      Pain Score 7     Pain Loc      Pain Edu?      Excl. in GC?    No data found.  Updated Vital Signs BP 105/68 (BP Location: Left Arm)   Pulse 87   Temp 98.6 F (37  C) (Oral)   Resp 18   LMP 07/16/2022 (Approximate)   SpO2 96%   Visual Acuity Right Eye Distance:   Left Eye Distance:   Bilateral Distance:    Right Eye Near:   Left Eye Near:    Bilateral Near:     Physical Exam Vitals reviewed.  Constitutional:      General: She is awake.     Appearance: Normal appearance. She is well-developed and well-groomed.  HENT:     Head: Normocephalic and atraumatic.  Pulmonary:     Effort: Pulmonary effort is normal.  Abdominal:     General: Abdomen is flat. Bowel sounds are normal.     Palpations: Abdomen is soft.     Tenderness: There is abdominal tenderness in the suprapubic area. There is no right CVA tenderness, left CVA tenderness or guarding.  Neurological:     General: No focal deficit present.     Mental Status: She is alert and oriented to person, place, and time. Mental status is at baseline.  Psychiatric:        Mood and Affect: Mood normal.        Behavior: Behavior normal. Behavior is cooperative.        Thought Content: Thought content normal.        Judgment: Judgment normal.      UC Treatments / Results  Labs (all labs ordered are listed, but only abnormal results are displayed) Labs Reviewed  POCT  URINALYSIS DIP (MANUAL ENTRY)  POCT URINE PREGNANCY  CERVICOVAGINAL ANCILLARY ONLY    EKG   Radiology No results found.  Procedures Procedures (including critical care time)  Medications Ordered in UC Medications - No data to display  Initial Impression / Assessment and Plan / UC Course  I have reviewed the triage vital signs and the nursing notes.  Pertinent labs & imaging results that were available during my care of the patient were reviewed by me and considered in my medical decision making (see chart for details).    Urinary tract infection with hematuria, site unspecified  Acute, recurrent concern Patient reports dysuria, mild urinary hesitancy, sensation of incomplete voiding as well as increased urinary frequency over the last 5 days  urinalysis results were reviewed with patient during appointment UA results are consistent with UTI as it was positive for leukocytes, RBCs, nitrites Will send in prescription for Macrobid 100 mg p.o. twice daily x 5 days Urine culture results pending along with cervicovaginal swab Results to dictate further management  will also provide Pyridium to assist with discomfort Follow-up as needed for persistent or progressing symptoms  Final Clinical Impressions(s) / UC Diagnoses   Final diagnoses:  None   Discharge Instructions   None    ED Prescriptions   None    PDMP not reviewed this encounter.   Roselind Messier 07/21/22 2055

## 2022-07-21 NOTE — ED Triage Notes (Signed)
Symptoms started 5 days ago

## 2022-07-21 NOTE — ED Notes (Signed)
Patient sent to bathroom with instructions for obtaining specimens

## 2022-07-21 NOTE — ED Triage Notes (Signed)
Reports cloudy urine, painful at end of urinary stream.  Denies abdominal pain, denies back pain.    Has not had any medicines.    Patient just stopped period yesterday

## 2022-07-21 NOTE — Discharge Instructions (Signed)
Based on your symptoms and results of the urinalysis I believe you have a UTI I recommend the following:  I have sent in a script for Macrobid 100 mg to be taken by mouth twice per day   Please finish the entire course of the antibiotic even if you are feeling better before it is completed. Stay well hydrated (at least 75 oz of water per day) and avoid holding your urine Keep you updated on the results of your urine culture as well as your cervicovaginal swab once they become available If your urine culture shows that a different antibiotic is needed we will update you and send in a new 1 but please continue to take the Macrobid until advised otherwise If you have any of the following please let us know: persistent symptoms, fever, trouble urinating or inability to urinate, confusion, flank pain.

## 2022-07-22 LAB — URINE CULTURE

## 2022-07-23 LAB — URINE CULTURE: Culture: 80000 — AB

## 2022-07-26 ENCOUNTER — Telehealth (HOSPITAL_COMMUNITY): Payer: Self-pay | Admitting: Emergency Medicine

## 2022-07-26 LAB — CERVICOVAGINAL ANCILLARY ONLY
Bacterial Vaginitis (gardnerella): POSITIVE — AB
Candida Glabrata: NEGATIVE
Candida Vaginitis: NEGATIVE
Chlamydia: NEGATIVE
Comment: NEGATIVE
Comment: NEGATIVE
Comment: NEGATIVE
Comment: NEGATIVE
Comment: NEGATIVE
Comment: NORMAL
Neisseria Gonorrhea: NEGATIVE
Trichomonas: NEGATIVE

## 2022-07-26 MED ORDER — METRONIDAZOLE 0.75 % VA GEL: 1.0000 | Freq: Every day | VAGINAL | 0 refills | Status: AC

## 2022-10-11 ENCOUNTER — Ambulatory Visit (HOSPITAL_COMMUNITY)
Admission: EM | Admit: 2022-10-11 | Discharge: 2022-10-11 | Disposition: A | Discharge status: Home / Self Care | Payer: Self-pay | Attending: Emergency Medicine | Admitting: Emergency Medicine

## 2022-10-11 DIAGNOSIS — Z113 Encounter for screening for infections with a predominantly sexual mode of transmission: Secondary | ICD-10-CM

## 2022-10-11 LAB — RPR: RPR Ser Ql: NONREACTIVE

## 2022-10-11 LAB — HIV ANTIBODY (ROUTINE TESTING W REFLEX): HIV Screen 4th Generation wRfx: NONREACTIVE

## 2022-10-11 NOTE — ED Provider Notes (Signed)
[facility]    CSN: [id number] Arrival date & time: 10/11/22  0803     History   Chief Complaint Chief Complaint  Patient presents with   Exposure to STD    HPI Tina Knox is a 32 y.o. female.  Here for STD testing.  Requesting blood work. Denies known exposure although reports unprotected intercourse recently.  She is not having any symptoms.  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  She does have an OB/GYN.  LMP 7/10  Past Medical History:  Diagnosis Date   Anemia    with last pregnancy   Trichomonas     Patient Active Problem List   Diagnosis Date Noted   Dysfunctional uterine bleeding 01/04/2022   Prediabetes 09/19/2021   Marijuana smoker 01/26/2011   Anemia 01/12/2011    Past Surgical History:  Procedure Laterality Date   BILATERAL SALPINGECTOMY  03/02/2012   Procedure: BILATERAL SALPINGECTOMY;  Surgeon: Allie Bossier, MD;  Location: [facility];  Service: Gynecology;  Laterality: Bilateral;   INDUCED ABORTION      OB History     Gravida  4   Para  3   Term  3   Preterm      AB  1   Living  3      SAB      IAB  1   Ectopic      Multiple      Live Births  3            Home Medications    Prior to Admission medications   Medication Sig Start Date End Date Taking? Authorizing Provider  NON FORMULARY URO probiotics for women    [provider]  phenazopyridine (PYRIDIUM) 100 MG tablet Take 1 tablet (100 mg total) by mouth 3 (three) times daily as needed for pain. 07/21/22   Mecum, Oswaldo Conroy, PA-C    Family History Family History  Problem Relation Age of Onset   Other Neg Hx     Social History Social History   Tobacco Use   Smoking status: Every Day    Current packs/day: 0.25    Types: Cigarettes   Smokeless tobacco: Former    Quit date: 11/05/2011  Vaping Use   Vaping status: Never Used  Substance Use Topics   Alcohol use: No   Drug use: Not Currently     Types: Marijuana     Allergies   Metronidazole   Review of Systems Review of Systems As per HPI  Physical Exam Triage Vital Signs ED Triage Vitals [10/11/22 0835]  Encounter Vitals Group     BP 131/82     Systolic BP Percentile      Diastolic BP Percentile      Pulse Rate 80     Resp 16     Temp      Temp Source Oral     SpO2 97 %     Weight      Height      Head Circumference      Peak Flow      Pain Score      Pain Loc      Pain Education      Exclude from Growth Chart    No data found.  Updated Vital Signs BP 131/82 (BP Location: Left Arm)   Pulse 80   Resp 16   LMP 09/29/2022 (Approximate)   SpO2 97%    Physical Exam  Vitals and nursing note reviewed.  Constitutional:      General: She is not in acute distress.    Appearance: Normal appearance.  HENT:     Mouth/Throat:     Pharynx: Oropharynx is clear.  Cardiovascular:     Rate and Rhythm: Normal rate and regular rhythm.     Pulses: Normal pulses.  Pulmonary:     Effort: Pulmonary effort is normal.  Abdominal:     Palpations: Abdomen is soft.  Neurological:     Mental Status: She is alert and oriented to person, place, and time.    UC Treatments / Results  Labs (all labs ordered are listed, but only abnormal results are displayed) Labs Reviewed  HIV ANTIBODY (ROUTINE TESTING W REFLEX)  RPR  CERVICOVAGINAL ANCILLARY ONLY    EKG  Radiology No results found.  Procedures Procedures   Medications Ordered in UC Medications - No data to display  Initial Impression / Assessment and Plan / UC Course  I have reviewed the triage vital signs and the nursing notes.  Pertinent labs & imaging results that were available during my care of the patient were reviewed by me and considered in my medical decision making (see chart for details).  Cytology swab, HIV and RPR currently pending If she is positive for BV, prefers vaginal gel. The pills give her many side effects.  Discussion about BV  and STDs. All questions answered to patient satisfaction. I have advised she follow with her ob/gyn to discuss BV recurrence and other possible treatments. Patient is agreeable to plan. Can return with any concerns. Also advised on setting up with PCP via QR code on AVS  Final Clinical Impressions(s) / UC Diagnoses   Final diagnoses:  Screen for STD (sexually transmitted disease)     Discharge Instructions      We will call you if anything on your swab returns positive. You can also see these results on MyChart. Please abstain from sexual intercourse until your results return.     ED Prescriptions   None    PDMP not reviewed this encounter.   Marlow Baars, [place] 10/11/22 [phone]

## 2022-10-11 NOTE — ED Triage Notes (Signed)
Pt would like STI-testing with blood work. Denies any symptoms.

## 2022-10-11 NOTE — Discharge Instructions (Signed)
We will call you if anything on your swab returns positive. You can also see these results on MyChart. Please abstain from sexual intercourse until your results return. 

## 2022-10-12 LAB — CERVICOVAGINAL ANCILLARY ONLY
Bacterial Vaginitis (gardnerella): POSITIVE — AB
Candida Glabrata: NEGATIVE
Candida Vaginitis: NEGATIVE
Chlamydia: NEGATIVE
Comment: NEGATIVE
Comment: NEGATIVE
Comment: NEGATIVE
Comment: NEGATIVE
Comment: NEGATIVE
Comment: NORMAL
Neisseria Gonorrhea: NEGATIVE
Trichomonas: NEGATIVE

## 2022-10-13 ENCOUNTER — Telehealth: Payer: Self-pay | Admitting: Emergency Medicine

## 2022-10-13 MED ORDER — METRONIDAZOLE 0.75 % VA GEL: 1.0000 | Freq: Every day | VAGINAL | 0 refills | Status: AC

## 2023-04-03 ENCOUNTER — Encounter (HOSPITAL_COMMUNITY): Payer: Self-pay

## 2023-04-03 ENCOUNTER — Ambulatory Visit (HOSPITAL_COMMUNITY)
Admission: EM | Admit: 2023-04-03 | Discharge: 2023-04-03 | Disposition: A | Discharge status: Home / Self Care | Payer: Self-pay | Attending: Emergency Medicine | Admitting: Emergency Medicine

## 2023-04-03 DIAGNOSIS — N898 Other specified noninflammatory disorders of vagina: Secondary | ICD-10-CM | POA: Insufficient documentation

## 2023-04-03 DIAGNOSIS — Z202 Contact with and (suspected) exposure to infections with a predominantly sexual mode of transmission: Secondary | ICD-10-CM | POA: Insufficient documentation

## 2023-04-03 NOTE — ED Provider Notes (Signed)
 [facility]    CSN: [id number] Arrival date & time: 04/03/23  1047      History   Chief Complaint Chief Complaint  Patient presents with   STD testing    HPI Tina Knox is a 33 y.o. female.   Vaginal discharge x 4 days.  She reports some itching and burning there is a brown tinge to the discharge.  She is requesting STD testing.  She is unsure of her risk of STD exposure. She has had unprotected sexual contact     Past Medical History:  Diagnosis Date   Anemia    with last pregnancy   Trichomonas     Patient Active Problem List   Diagnosis Date Noted   Dysfunctional uterine bleeding 01/04/2022   Prediabetes 09/19/2021   Marijuana smoker 01/26/2011   Anemia 01/12/2011    Past Surgical History:  Procedure Laterality Date   BILATERAL SALPINGECTOMY  03/02/2012   Procedure: BILATERAL SALPINGECTOMY;  Surgeon: Harland JAYSON Birkenhead, MD;  Location: [facility];  Service: Gynecology;  Laterality: Bilateral;   INDUCED ABORTION      OB History     Gravida  4   Para  3   Term  3   Preterm      AB  1   Living  3      SAB      IAB  1   Ectopic      Multiple      Live Births  3            Home Medications    Prior to Admission medications   Medication Sig Start Date End Date Taking? Authorizing Provider  NON FORMULARY URO probiotics for women    [provider]  phenazopyridine  (PYRIDIUM ) 100 MG tablet Take 1 tablet (100 mg total) by mouth 3 (three) times daily as needed for pain. 07/21/22   Mecum, Rocky BRAVO, PA-C    Family History Family History  Problem Relation Age of Onset   Other Neg Hx     Social History Social History   Tobacco Use   Smoking status: Every Day    Current packs/day: 0.25    Types: Cigarettes   Smokeless tobacco: Former    Quit date: 11/05/2011  Vaping Use   Vaping status: Never Used  Substance Use Topics   Alcohol use: No   Drug use: Not Currently    Types: Marijuana     Allergies    Metronidazole    Review of Systems Review of Systems  Constitutional: Negative.   Genitourinary:  Positive for vaginal discharge.       Vaginal itching and increased clear browning discharge  All other systems reviewed and are negative.    Physical Exam Triage Vital Signs ED Triage Vitals [04/03/23 1123]  Encounter Vitals Group     BP 104/67     Systolic BP Percentile      Diastolic BP Percentile      Pulse Rate 76     Resp 16     Temp 98.3 F (36.8 C)     Temp Source Oral     SpO2 93 %     Weight      Height      Head Circumference      Peak Flow      Pain Score 0     Pain Loc      Pain Education      Exclude from Growth Chart    No  data found.  Updated Vital Signs BP 104/67 (BP Location: Left Arm)   Pulse 76   Temp 98.3 F (36.8 C) (Oral)   Resp 16   LMP 03/10/2023 (Approximate)   SpO2 93%   Visual Acuity Right Eye Distance:   Left Eye Distance:   Bilateral Distance:    Right Eye Near:   Left Eye Near:    Bilateral Near:     Physical Exam Vitals and nursing note reviewed.  Constitutional:      Appearance: Normal appearance.  Abdominal:     Palpations: Abdomen is soft.     Tenderness: There is no abdominal tenderness.  Genitourinary:    General: Normal vulva.     Vagina: Vaginal discharge present.     Comments: Physical exam deferred.  Reports no lesions, bumps.  Mild increase in clear with brownish tinged discharge.   Skin:    Findings: No rash.  Neurological:     Mental Status: She is alert.      UC Treatments / Results  Labs (all labs ordered are listed, but only abnormal results are displayed) Labs Reviewed  CERVICOVAGINAL ANCILLARY ONLY - Abnormal; Notable for the following components:      Result Value   Bacterial Vaginitis (gardnerella) Positive (*)    All other components within normal limits    EKG   Radiology No results found.  Procedures Procedures (including critical care time)  Medications Ordered in  UC Medications - No data to display  Initial Impression / Assessment and Plan / UC Course  I have reviewed the triage vital signs and the nursing notes.  Pertinent labs & imaging results that were available during my care of the patient were reviewed by me and considered in my medical decision making (see chart for details).    We have discussed types of vaginal discharge that would be symptomatic of STDs. She is negative for vaginal discharge  That is of concern. We will complete STD testing at this time per patient's request and call her with any positive results. General GYN consult recommended for follow up on discharge.  Final Clinical Impressions(s) / UC Diagnoses   Final diagnoses:  Possible exposure to STD  Vaginal discharge     Discharge Instructions      You will be called with the results of your testing and treated at that time.      ED Prescriptions   None    PDMP not reviewed this encounter.   Sumner Marval HERO, NP 04/24/23 1146

## 2023-04-03 NOTE — Discharge Instructions (Addendum)
 You will be called with the results of your testing and treated at that time.

## 2023-04-03 NOTE — ED Triage Notes (Signed)
 Patient c/o a brown vaginal discharge with a foul odor x 4 days. Patient denies any UTI symptoms.

## 2023-04-04 ENCOUNTER — Telehealth (HOSPITAL_BASED_OUTPATIENT_CLINIC_OR_DEPARTMENT_OTHER): Payer: Self-pay

## 2023-04-04 LAB — CERVICOVAGINAL ANCILLARY ONLY
Bacterial Vaginitis (gardnerella): POSITIVE — AB
Candida Glabrata: NEGATIVE
Candida Vaginitis: NEGATIVE
Chlamydia: NEGATIVE
Comment: NEGATIVE
Comment: NEGATIVE
Comment: NEGATIVE
Comment: NEGATIVE
Comment: NEGATIVE
Comment: NORMAL
Neisseria Gonorrhea: NEGATIVE
Trichomonas: NEGATIVE

## 2023-04-04 MED ORDER — METRONIDAZOLE 0.75 % VA GEL: 1.0000 | Freq: Every day | VAGINAL | 0 refills | Status: AC

## 2023-04-04 NOTE — Telephone Encounter (Signed)
 Per protocol, pt requires tx with metronidazole. Rx sent to pharmacy on file.

## 2023-07-02 ENCOUNTER — Ambulatory Visit (HOSPITAL_COMMUNITY)
Admission: EM | Admit: 2023-07-02 | Discharge: 2023-07-02 | Disposition: A | Discharge status: Home / Self Care | Payer: Self-pay | Attending: Internal Medicine | Admitting: Internal Medicine

## 2023-07-02 ENCOUNTER — Encounter (HOSPITAL_COMMUNITY): Payer: Self-pay | Admitting: Emergency Medicine

## 2023-07-02 DIAGNOSIS — Z113 Encounter for screening for infections with a predominantly sexual mode of transmission: Secondary | ICD-10-CM | POA: Insufficient documentation

## 2023-07-02 LAB — HIV ANTIBODY (ROUTINE TESTING W REFLEX): HIV Screen 4th Generation wRfx: NONREACTIVE

## 2023-07-02 NOTE — ED Triage Notes (Signed)
 Pt reports found out her sexual partner was with someone else so requesting STD testing. Reports brownish vaginal discharge for a few days.

## 2023-07-02 NOTE — ED Provider Notes (Signed)
 [facility]    CSN: [id number] Arrival date & time: 07/02/23  1026      History   Chief Complaint Chief Complaint  Patient presents with   SEXUALLY TRANSMITTED DISEASE    HPI Tina Knox is a 33 y.o. female.   33 year old female who presents to urgent care requesting STI testing.  She recently found out that her sexual partner has been sleeping with another person therefore she is wanting to have STI testing.  She does not know that they have anything specific.  She has had some brownish discharge for the last few days but does have recurrent bacterial vaginitis.  She denies any dysuria, hematuria, vaginal pain, vaginal itching, abdominal pain, fevers, chills.  The last time she saw her gynecologist was almost 2 years ago and she is due for a Pap smear.     Past Medical History:  Diagnosis Date   Anemia    with last pregnancy   Trichomonas     Patient Active Problem List   Diagnosis Date Noted   Dysfunctional uterine bleeding 01/04/2022   Prediabetes 09/19/2021   Marijuana smoker 01/26/2011   Anemia 01/12/2011    Past Surgical History:  Procedure Laterality Date   BILATERAL SALPINGECTOMY  03/02/2012   Procedure: BILATERAL SALPINGECTOMY;  Surgeon: Allie Bossier, MD;  Location: [facility];  Service: Gynecology;  Laterality: Bilateral;   INDUCED ABORTION      OB History     Gravida  4   Para  3   Term  3   Preterm      AB  1   Living  3      SAB      IAB  1   Ectopic      Multiple      Live Births  3            Home Medications    Prior to Admission medications   Medication Sig Start Date End Date Taking? Authorizing Provider  NON FORMULARY URO probiotics for women    [provider]  phenazopyridine (PYRIDIUM) 100 MG tablet Take 1 tablet (100 mg total) by mouth 3 (three) times daily as needed for pain. 07/21/22   Mecum, Oswaldo Conroy, PA-C    Family History Family History  Problem Relation Age of Onset   Other Neg Hx      Social History Social History   Tobacco Use   Smoking status: Every Day    Current packs/day: 0.25    Types: Cigarettes   Smokeless tobacco: Former    Quit date: 11/05/2011  Vaping Use   Vaping status: Never Used  Substance Use Topics   Alcohol use: No   Drug use: Not Currently    Types: Marijuana     Allergies   Metronidazole   Review of Systems Review of Systems  Constitutional:  Negative for chills and fever.  HENT:  Negative for ear pain and sore throat.   Eyes:  Negative for pain and visual disturbance.  Respiratory:  Negative for cough and shortness of breath.   Cardiovascular:  Negative for chest pain and palpitations.  Gastrointestinal:  Negative for abdominal pain and vomiting.  Genitourinary:  Positive for vaginal discharge. Negative for dysuria and hematuria.  Musculoskeletal:  Negative for arthralgias and back pain.  Skin:  Negative for color change and rash.  Neurological:  Negative for seizures and syncope.  All other systems reviewed and are negative.    Physical Exam Triage Vital Signs  ED Triage Vitals  Encounter Vitals Group     BP 07/02/23 1131 107/69     Systolic BP Percentile --      Diastolic BP Percentile --      Pulse Rate 07/02/23 1131 74     Resp 07/02/23 1131 16     Temp 07/02/23 1131 98.3 F (36.8 C)     Temp Source 07/02/23 1131 Oral     SpO2 07/02/23 1131 100 %     Weight --      Height --      Head Circumference --      Peak Flow --      Pain Score 07/02/23 1130 0     Pain Loc --      Pain Education --      Exclude from Growth Chart --    No data found.  Updated Vital Signs BP 107/69 (BP Location: Right Arm)   Pulse 74   Temp 98.3 F (36.8 C) (Oral)   Resp 16   LMP 06/22/2023   SpO2 100%   Visual Acuity Right Eye Distance:   Left Eye Distance:   Bilateral Distance:    Right Eye Near:   Left Eye Near:    Bilateral Near:     Physical Exam Vitals and nursing note reviewed.  Constitutional:      General:  She is not in acute distress.    Appearance: She is well-developed.  HENT:     Head: Normocephalic and atraumatic.  Eyes:     Conjunctiva/sclera: Conjunctivae normal.  Cardiovascular:     Rate and Rhythm: Normal rate and regular rhythm.     Heart sounds: No murmur heard. Pulmonary:     Effort: Pulmonary effort is normal. No respiratory distress.     Breath sounds: Normal breath sounds.  Abdominal:     Palpations: Abdomen is soft.     Tenderness: There is no abdominal tenderness.  Musculoskeletal:        General: No swelling.     Cervical back: Neck supple.  Skin:    General: Skin is warm and dry.     Capillary Refill: Capillary refill takes less than 2 seconds.  Neurological:     Mental Status: She is alert.  Psychiatric:        Mood and Affect: Mood normal.      UC Treatments / Results  Labs (all labs ordered are listed, but only abnormal results are displayed) Labs Reviewed  RPR  HIV ANTIBODY (ROUTINE TESTING W REFLEX)  CERVICOVAGINAL ANCILLARY ONLY    EKG   Radiology No results found.  Procedures Procedures (including critical care time)  Medications Ordered in UC Medications - No data to display  Initial Impression / Assessment and Plan / UC Course  I have reviewed the triage vital signs and the nursing notes.  Pertinent labs & imaging results that were available during my care of the patient were reviewed by me and considered in my medical decision making (see chart for details).     Screening examination for STI   Screening swab and blood work done today and results will be available in 24-48 hours. We will contact you if we need to arrange additional treatment based on your testing. Negative results will be on your MyChart account. Abstain from sex until you receive your final results.  Use a condom for sexual encounters. If you have any worsening or changing symptoms including abnormal discharge, pelvic pain, abdominal pain, fever, nausea, or  vomiting, then you should be reevaluated.    Recommend following up with gynecology for recurrent bacterial vaginitis as well as for updated Pap smear  Final Clinical Impressions(s) / UC Diagnoses   Final diagnoses:  Screening examination for STI     Discharge Instructions      Screening swab and blood work done today and results will be available in 24-48 hours. We will contact you if we need to arrange additional treatment based on your testing. Negative results will be on your MyChart account. Abstain from sex until you receive your final results.  Use a condom for sexual encounters. If you have any worsening or changing symptoms including abnormal discharge, pelvic pain, abdominal pain, fever, nausea, or vomiting, then you should be reevaluated.    Recommend following up with gynecology for recurrent bacterial vaginitis as well as for updated Pap smear   ED Prescriptions   None    PDMP not reviewed this encounter.   Kreg Pesa, [place] 07/02/23 1204

## 2023-07-02 NOTE — Discharge Instructions (Addendum)
 Screening swab and blood work done today and results will be available in 24-48 hours. We will contact you if we need to arrange additional treatment based on your testing. Negative results will be on your MyChart account. Abstain from sex until you receive your final results.  Use a condom for sexual encounters. If you have any worsening or changing symptoms including abnormal discharge, pelvic pain, abdominal pain, fever, nausea, or vomiting, then you should be reevaluated.    Recommend following up with gynecology for recurrent bacterial vaginitis as well as for updated Pap smear

## 2023-07-03 LAB — RPR: RPR Ser Ql: NONREACTIVE

## 2023-07-06 ENCOUNTER — Telehealth (HOSPITAL_COMMUNITY): Payer: Self-pay

## 2023-07-06 LAB — CERVICOVAGINAL ANCILLARY ONLY
Bacterial Vaginitis (gardnerella): POSITIVE — AB
Candida Glabrata: NEGATIVE
Candida Vaginitis: NEGATIVE
Comment: NEGATIVE
Comment: NEGATIVE
Comment: NEGATIVE
Comment: NEGATIVE
Trichomonas: NEGATIVE

## 2023-07-06 MED ORDER — METRONIDAZOLE 0.75 % VA GEL: 1.0000 | Freq: Every day | VAGINAL | 0 refills | Status: AC

## 2023-07-06 NOTE — Telephone Encounter (Signed)
 Per protocol, pt requires tx with metrogel. Rx sent to pharmacy on file.

## 2023-09-05 ENCOUNTER — Ambulatory Visit (HOSPITAL_COMMUNITY)
Admission: EM | Admit: 2023-09-05 | Discharge: 2023-09-05 | Disposition: A | Discharge status: Home / Self Care | Payer: Self-pay | Attending: Emergency Medicine | Admitting: Emergency Medicine

## 2023-09-05 ENCOUNTER — Encounter (HOSPITAL_COMMUNITY): Payer: Self-pay

## 2023-09-05 DIAGNOSIS — N898 Other specified noninflammatory disorders of vagina: Secondary | ICD-10-CM | POA: Insufficient documentation

## 2023-09-05 DIAGNOSIS — B3731 Acute candidiasis of vulva and vagina: Secondary | ICD-10-CM | POA: Insufficient documentation

## 2023-09-05 LAB — POCT URINALYSIS DIP (MANUAL ENTRY)
Bilirubin, UA: NEGATIVE
Glucose, UA: NEGATIVE mg/dL
Ketones, POC UA: NEGATIVE mg/dL
Leukocytes, UA: NEGATIVE
Nitrite, UA: NEGATIVE
Protein Ur, POC: NEGATIVE mg/dL
Spec Grav, UA: 1.015 (ref 1.010–1.025)
Urobilinogen, UA: 0.2 U/dL
pH, UA: 6 (ref 5.0–8.0)

## 2023-09-05 LAB — POCT URINE PREGNANCY: Preg Test, Ur: NEGATIVE

## 2023-09-05 MED ORDER — FLUCONAZOLE 150 MG PO TABS: ORAL_TABLET | ORAL | 0 refills | Status: AC

## 2023-09-05 MED ORDER — FLUCONAZOLE 150 MG PO TABS: ORAL_TABLET | ORAL | 0 refills | Status: DC

## 2023-09-05 NOTE — ED Provider Notes (Signed)
 [facility]    CSN: [id number] Arrival date & time: 09/05/23  1946      History   Chief Complaint Chief Complaint  Patient presents with   Vaginal Itching    HPI Tina Knox is a 33 y.o. female.   Patient presents with vaginal itching and discomfort x 5 days.  Patient also does endorse some increased urination over the last few days and states that she has been having to get up in the middle of the night to urinate.    Patient states that she does have a history of yeast vaginitis and bacterial vaginosis and is concerned that she may have either one of these.  Patient denies any significant vaginal discharge.  Patient also denies any abnormal vaginal bleeding, vaginal pain, abdominal pain, flank pain, dysuria, and hematuria. LMP 08/09/2023.  The history is provided by the patient and medical records.  Vaginal Itching    Past Medical History:  Diagnosis Date   Anemia    with last pregnancy   Trichomonas     Patient Active Problem List   Diagnosis Date Noted   Dysfunctional uterine bleeding 01/04/2022   Prediabetes 09/19/2021   Marijuana smoker 01/26/2011   Anemia 01/12/2011    Past Surgical History:  Procedure Laterality Date   BILATERAL SALPINGECTOMY  03/02/2012   Procedure: BILATERAL SALPINGECTOMY;  Surgeon: Ana Balling, MD;  Location: [facility];  Service: Gynecology;  Laterality: Bilateral;   INDUCED ABORTION      OB History     Gravida  4   Para  3   Term  3   Preterm      AB  1   Living  3      SAB      IAB  1   Ectopic      Multiple      Live Births  3            Home Medications    Prior to Admission medications   Medication Sig Start Date End Date Taking? Authorizing Provider  fluconazole  (DIFLUCAN ) 150 MG tablet Take one tablet today and one tablet in 3 days if symptoms persist. 09/05/23   Karon Packer, NP  NON FORMULARY URO probiotics for women    [provider]    Family History Family  History  Problem Relation Age of Onset   Other Neg Hx     Social History Social History   Tobacco Use   Smoking status: Every Day    Current packs/day: 0.25    Types: Cigarettes   Smokeless tobacco: Former    Quit date: 11/05/2011  Vaping Use   Vaping status: Never Used  Substance Use Topics   Alcohol use: No   Drug use: Not Currently    Types: Marijuana     Allergies   Metronidazole    Review of Systems Review of Systems  Per HPI  Physical Exam Triage Vital Signs ED Triage Vitals [09/05/23 1957]  Encounter Vitals Group     BP 117/78     Girls Systolic BP Percentile      Girls Diastolic BP Percentile      Boys Systolic BP Percentile      Boys Diastolic BP Percentile      Pulse Rate 79     Resp 16     Temp 99 F (37.2 C)     Temp Source Oral     SpO2 98 %     Weight  Height      Head Circumference      Peak Flow      Pain Score 0     Pain Loc      Pain Education      Exclude from Growth Chart    No data found.  Updated Vital Signs BP 117/78 (BP Location: Left Arm)   Pulse 79   Temp 99 F (37.2 C) (Oral)   Resp 16   LMP 08/09/2023 (Exact Date)   SpO2 98%   Visual Acuity Right Eye Distance:   Left Eye Distance:   Bilateral Distance:    Right Eye Near:   Left Eye Near:    Bilateral Near:     Physical Exam Vitals and nursing note reviewed.  Constitutional:      General: She is awake. She is not in acute distress.    Appearance: Normal appearance. She is well-developed and well-groomed. She is not ill-appearing.  Genitourinary:    Comments: Exam deferred  Neurological:     Mental Status: She is alert.   Psychiatric:        Behavior: Behavior is cooperative.      UC Treatments / Results  Labs (all labs ordered are listed, but only abnormal results are displayed) Labs Reviewed  POCT URINALYSIS DIP (MANUAL ENTRY) - Abnormal; Notable for the following components:      Result Value   Blood, UA trace-intact (*)    All other  components within normal limits  POCT URINE PREGNANCY  CERVICOVAGINAL ANCILLARY ONLY    EKG   Radiology No results found.  Procedures Procedures (including critical care time)  Medications Ordered in UC Medications - No data to display  Initial Impression / Assessment and Plan / UC Course  I have reviewed the triage vital signs and the nursing notes.  Pertinent labs & imaging results that were available during my care of the patient were reviewed by me and considered in my medical decision making (see chart for details).     Patient is well-appearing.  Vitals are stable.  GU exam deferred.  Patient perform self swab for STD/STI.  HIV and RPR declined.  Urinalysis unremarkable.  Urine pregnancy is negative.  Prescribed Diflucan  for yeast vaginitis coverage.  Discussed follow-up and return precautions. Final Clinical Impressions(s) / UC Diagnoses   Final diagnoses:  Vaginal itching  Yeast vaginitis     Discharge Instructions      Take 1 tablet of Diflucan  when you pick up your prescription and another tablet in 3 days if your symptoms persist. Your results will return over the next few days and someone will call if results require any additional treatment.  Your results will also be available to you on MyChart. Return here as needed.     ED Prescriptions     Medication Sig Dispense Auth. Provider   fluconazole  (DIFLUCAN ) 150 MG tablet  (Status: Discontinued) Take one tablet today and one tablet in 3 days if symptoms persist. 2 tablet Levora Reas A, NP   fluconazole  (DIFLUCAN ) 150 MG tablet Take one tablet today and one tablet in 3 days if symptoms persist. 2 tablet Levora Reas A, NP      PDMP not reviewed this encounter.   Levora Reas A, NP 09/05/23 2026

## 2023-09-05 NOTE — ED Triage Notes (Signed)
 Pt c/o vaginal itching x5 days. States hasn't shaved in a few months. States hx of yeast and BV.

## 2023-09-05 NOTE — Discharge Instructions (Signed)
 Take 1 tablet of Diflucan  when you pick up your prescription and another tablet in 3 days if your symptoms persist. Your results will return over the next few days and someone will call if results require any additional treatment.  Your results will also be available to you on MyChart. Return here as needed.

## 2023-09-06 LAB — CERVICOVAGINAL ANCILLARY ONLY
Bacterial Vaginitis (gardnerella): NEGATIVE
Candida Glabrata: NEGATIVE
Candida Vaginitis: NEGATIVE
Chlamydia: NEGATIVE
Comment: NEGATIVE
Comment: NEGATIVE
Comment: NEGATIVE
Comment: NEGATIVE
Comment: NEGATIVE
Comment: NORMAL
Neisseria Gonorrhea: NEGATIVE
Trichomonas: NEGATIVE

## 2023-12-10 ENCOUNTER — Encounter (HOSPITAL_COMMUNITY): Payer: Self-pay

## 2023-12-10 ENCOUNTER — Ambulatory Visit (HOSPITAL_COMMUNITY)
Admission: EM | Admit: 2023-12-10 | Discharge: 2023-12-10 | Disposition: A | Discharge status: Home / Self Care | Payer: Self-pay | Attending: Physician Assistant | Admitting: Physician Assistant

## 2023-12-10 DIAGNOSIS — N760 Acute vaginitis: Secondary | ICD-10-CM | POA: Insufficient documentation

## 2023-12-10 DIAGNOSIS — Z113 Encounter for screening for infections with a predominantly sexual mode of transmission: Secondary | ICD-10-CM | POA: Insufficient documentation

## 2023-12-10 DIAGNOSIS — B9689 Other specified bacterial agents as the cause of diseases classified elsewhere: Secondary | ICD-10-CM | POA: Insufficient documentation

## 2023-12-10 LAB — POCT URINALYSIS DIP (MANUAL ENTRY)
Bilirubin, UA: NEGATIVE
Blood, UA: NEGATIVE
Glucose, UA: NEGATIVE mg/dL
Ketones, POC UA: NEGATIVE mg/dL
Leukocytes, UA: NEGATIVE
Nitrite, UA: NEGATIVE
Protein Ur, POC: NEGATIVE mg/dL
Spec Grav, UA: 1.025 (ref 1.010–1.025)
Urobilinogen, UA: 0.2 U/dL
pH, UA: 6 (ref 5.0–8.0)

## 2023-12-10 LAB — HIV ANTIBODY (ROUTINE TESTING W REFLEX): HIV Screen 4th Generation wRfx: NONREACTIVE

## 2023-12-10 LAB — POCT URINE PREGNANCY: Preg Test, Ur: NEGATIVE

## 2023-12-10 MED ORDER — METRONIDAZOLE 0.75 % VA GEL: 1.0000 | Freq: Every day | VAGINAL | 0 refills | Status: AC

## 2023-12-10 NOTE — Discharge Instructions (Signed)
 Use MetroGel  as prescribed, once at bedtime for 5 days. Will call with test results and change treatment plan if indicated.

## 2023-12-10 NOTE — ED Provider Notes (Signed)
 [facility]    CSN: [id number] Arrival date & time: 12/10/23  1006      History   Chief Complaint No chief complaint on file.   HPI Tina Knox is a 33 y.o. female.   Patient presents with 1 week of vaginal itching, increased brownish discharge, foul odor.  She reports a history of persistent BV.  She typically takes something daily for this, over-the-counter supplement, but has not been taking recently.  She also reports new sexual partner with unprotected intercourse the past month or so.  Last menstrual period August 25.  She is requesting pregnancy test today.  Denies abdominal pain, fever, chills, dysuria, hematuria.    Past Medical History:  Diagnosis Date   Anemia    with last pregnancy   Trichomonas     Patient Active Problem List   Diagnosis Date Noted   Dysfunctional uterine bleeding 01/04/2022   Prediabetes 09/19/2021   Marijuana smoker 01/26/2011   Anemia 01/12/2011    Past Surgical History:  Procedure Laterality Date   BILATERAL SALPINGECTOMY  03/02/2012   Procedure: BILATERAL SALPINGECTOMY;  Surgeon: Harland JAYSON Birkenhead, MD;  Location: [facility];  Service: Gynecology;  Laterality: Bilateral;   INDUCED ABORTION      OB History     Gravida  4   Para  3   Term  3   Preterm      AB  1   Living  3      SAB      IAB  1   Ectopic      Multiple      Live Births  3            Home Medications    Prior to Admission medications   Medication Sig Start Date End Date Taking? Authorizing Provider  fluconazole  (DIFLUCAN ) 150 MG tablet Take one tablet today and one tablet in 3 days if symptoms persist. 09/05/23   Johnie Flaming A, NP  metroNIDAZOLE  (METROGEL ) 0.75 % vaginal gel Place 1 Applicatorful vaginally at bedtime for 5 days. 12/10/23 12/15/23 Yes Ward, Harlene PEDLAR, PA-C  NON FORMULARY URO probiotics for women    [provider]    Family History Family History  Problem Relation Age of Onset   Other Neg Hx      Social History Social History   Tobacco Use   Smoking status: Every Day    Current packs/day: 0.25    Types: Cigarettes   Smokeless tobacco: Former    Quit date: 11/05/2011  Vaping Use   Vaping status: Never Used  Substance Use Topics   Alcohol use: No   Drug use: Not Currently    Types: Marijuana     Allergies   Metronidazole    Review of Systems Review of Systems  Constitutional:  Negative for chills and fever.  HENT:  Negative for ear pain and sore throat.   Eyes:  Negative for pain and visual disturbance.  Respiratory:  Negative for cough and shortness of breath.   Cardiovascular:  Negative for chest pain and palpitations.  Gastrointestinal:  Negative for abdominal pain and vomiting.  Genitourinary:  Positive for vaginal discharge. Negative for dysuria and hematuria.  Musculoskeletal:  Negative for arthralgias and back pain.  Skin:  Negative for color change and rash.  Neurological:  Negative for seizures and syncope.  All other systems reviewed and are negative.    Physical Exam Triage Vital Signs ED Triage Vitals [12/10/23 1033]  Encounter Vitals Group  BP 107/65     Girls Systolic BP Percentile      Girls Diastolic BP Percentile      Boys Systolic BP Percentile      Boys Diastolic BP Percentile      Pulse Rate 99     Resp 18     Temp 98.1 F (36.7 C)     Temp Source Oral     SpO2 98 %     Weight      Height      Head Circumference      Peak Flow      Pain Score      Pain Loc      Pain Education      Exclude from Growth Chart    No data found.  Updated Vital Signs BP 107/65 (BP Location: Right Arm)   Pulse 99   Temp 98.1 F (36.7 C) (Oral)   Resp 18   LMP 11/14/2023 (Approximate)   SpO2 98%   Visual Acuity Right Eye Distance:   Left Eye Distance:   Bilateral Distance:    Right Eye Near:   Left Eye Near:    Bilateral Near:     Physical Exam Vitals and nursing note reviewed.  Constitutional:      General: She is not in  acute distress.    Appearance: She is well-developed.  HENT:     Head: Normocephalic and atraumatic.  Eyes:     Conjunctiva/sclera: Conjunctivae normal.  Cardiovascular:     Rate and Rhythm: Normal rate and regular rhythm.     Heart sounds: No murmur heard. Pulmonary:     Effort: Pulmonary effort is normal. No respiratory distress.     Breath sounds: Normal breath sounds.  Abdominal:     Palpations: Abdomen is soft.     Tenderness: There is no abdominal tenderness.  Musculoskeletal:        General: No swelling.     Cervical back: Neck supple.  Skin:    General: Skin is warm and dry.     Capillary Refill: Capillary refill takes less than 2 seconds.  Neurological:     Mental Status: She is alert.  Psychiatric:        Mood and Affect: Mood normal.      UC Treatments / Results  Labs (all labs ordered are listed, but only abnormal results are displayed) Labs Reviewed  POCT URINALYSIS DIP (MANUAL ENTRY) - Abnormal  HIV ANTIBODY (ROUTINE TESTING W REFLEX)  RPR  POCT URINE PREGNANCY  CERVICOVAGINAL ANCILLARY ONLY    EKG   Radiology No results found.  Procedures Procedures (including critical care time)  Medications Ordered in UC Medications - No data to display  Initial Impression / Assessment and Plan / UC Course  I have reviewed the triage vital signs and the nursing notes.  Pertinent labs & imaging results that were available during my care of the patient were reviewed by me and considered in my medical decision making (see chart for details).     Will treat for BV.  Urine pregnancy negative and UA normal in clinic today.  Cervicovaginal self swab pending.  Will change treatment plan if indicated based on results. Final Clinical Impressions(s) / UC Diagnoses   Final diagnoses:  Screen for STD (sexually transmitted disease)  BV (bacterial vaginosis)     Discharge Instructions      Use MetroGel  as prescribed, once at bedtime for 5 days. Will call with  test results and change  treatment plan if indicated.      ED Prescriptions     Medication Sig Dispense Auth. Provider   metroNIDAZOLE  (METROGEL ) 0.75 % vaginal gel Place 1 Applicatorful vaginally at bedtime for 5 days. 50 g Ward, Shanyiah Conde Z, PA-C      PDMP not reviewed this encounter.   Ward, Harlene PEDLAR, PA-C 12/10/23 1102

## 2023-12-10 NOTE — ED Triage Notes (Signed)
 Patient reports vaginal itching x 1 week. Patient would like blood work and pregnancy test.

## 2023-12-11 LAB — RPR: RPR Ser Ql: NONREACTIVE

## 2023-12-12 ENCOUNTER — Ambulatory Visit (HOSPITAL_COMMUNITY): Payer: Self-pay

## 2023-12-12 LAB — CERVICOVAGINAL ANCILLARY ONLY
Bacterial Vaginitis (gardnerella): POSITIVE — AB
Candida Glabrata: NEGATIVE
Candida Vaginitis: NEGATIVE
Chlamydia: NEGATIVE
Comment: NEGATIVE
Comment: NEGATIVE
Comment: NEGATIVE
Comment: NEGATIVE
Comment: NEGATIVE
Comment: NORMAL
Neisseria Gonorrhea: NEGATIVE
Trichomonas: NEGATIVE

## 2024-05-07 ENCOUNTER — Ambulatory Visit: Payer: Self-pay | Admitting: Obstetrics and Gynecology
